# Patient Record
Sex: Female | Born: 1965 | Hispanic: Refuse to answer | Marital: Single | Smoking: Current every day smoker
Health system: Southern US, Community
[De-identification: ages and names within clinical notes are randomized; demographics above are authoritative.]

## PROBLEM LIST (undated history)

## (undated) DIAGNOSIS — Z9889 Other specified postprocedural states: Secondary | ICD-10-CM

## (undated) DIAGNOSIS — T7840XA Allergy, unspecified, initial encounter: Secondary | ICD-10-CM

## (undated) DIAGNOSIS — F32A Depression, unspecified: Secondary | ICD-10-CM

## (undated) DIAGNOSIS — T8859XA Other complications of anesthesia, initial encounter: Secondary | ICD-10-CM

## (undated) DIAGNOSIS — K219 Gastro-esophageal reflux disease without esophagitis: Secondary | ICD-10-CM

## (undated) DIAGNOSIS — R112 Nausea with vomiting, unspecified: Secondary | ICD-10-CM

## (undated) DIAGNOSIS — M722 Plantar fascial fibromatosis: Secondary | ICD-10-CM

## (undated) DIAGNOSIS — F329 Major depressive disorder, single episode, unspecified: Secondary | ICD-10-CM

## (undated) DIAGNOSIS — T4145XA Adverse effect of unspecified anesthetic, initial encounter: Secondary | ICD-10-CM

## (undated) HISTORY — PX: ENDOMETRIAL ABLATION: SHX621

## (undated) HISTORY — DX: Major depressive disorder, single episode, unspecified: F32.9

## (undated) HISTORY — PX: COLPOSCOPY: SHX161

## (undated) HISTORY — DX: Depression, unspecified: F32.A

## (undated) HISTORY — DX: Allergy, unspecified, initial encounter: T78.40XA

## (undated) HISTORY — DX: Gastro-esophageal reflux disease without esophagitis: K21.9

---

## 1988-12-08 HISTORY — PX: TUBAL LIGATION: SHX77

## 1994-06-10 HISTORY — PX: WISDOM TOOTH EXTRACTION: SHX21

## 1998-01-15 ENCOUNTER — Emergency Department (HOSPITAL_COMMUNITY): Admission: EM | Admit: 1998-01-15 | Discharge: 1998-01-15 | Payer: Self-pay | Admitting: Internal Medicine

## 1998-03-04 ENCOUNTER — Emergency Department (HOSPITAL_COMMUNITY): Admission: EM | Admit: 1998-03-04 | Discharge: 1998-03-04 | Payer: Self-pay | Admitting: Emergency Medicine

## 1999-02-04 ENCOUNTER — Encounter: Payer: Self-pay | Admitting: *Deleted

## 1999-02-04 ENCOUNTER — Emergency Department (HOSPITAL_COMMUNITY): Admission: EM | Admit: 1999-02-04 | Discharge: 1999-02-04 | Payer: Self-pay | Admitting: *Deleted

## 1999-08-04 ENCOUNTER — Encounter: Payer: Self-pay | Admitting: Emergency Medicine

## 1999-08-04 ENCOUNTER — Emergency Department (HOSPITAL_COMMUNITY): Admission: EM | Admit: 1999-08-04 | Discharge: 1999-08-04 | Payer: Self-pay | Admitting: Emergency Medicine

## 1999-11-08 ENCOUNTER — Emergency Department (HOSPITAL_COMMUNITY): Admission: EM | Admit: 1999-11-08 | Discharge: 1999-11-08 | Payer: Self-pay | Admitting: Emergency Medicine

## 1999-11-08 ENCOUNTER — Encounter: Payer: Self-pay | Admitting: Emergency Medicine

## 2000-03-17 ENCOUNTER — Emergency Department (HOSPITAL_COMMUNITY): Admission: EM | Admit: 2000-03-17 | Discharge: 2000-03-17 | Payer: Self-pay | Admitting: Emergency Medicine

## 2000-06-18 ENCOUNTER — Emergency Department (HOSPITAL_COMMUNITY): Admission: EM | Admit: 2000-06-18 | Discharge: 2000-06-18 | Payer: Self-pay | Admitting: Emergency Medicine

## 2000-06-20 ENCOUNTER — Emergency Department (HOSPITAL_COMMUNITY): Admission: EM | Admit: 2000-06-20 | Discharge: 2000-06-20 | Payer: Self-pay | Admitting: Emergency Medicine

## 2000-07-12 ENCOUNTER — Emergency Department (HOSPITAL_COMMUNITY): Admission: EM | Admit: 2000-07-12 | Discharge: 2000-07-12 | Payer: Self-pay | Admitting: Emergency Medicine

## 2000-07-17 ENCOUNTER — Emergency Department (HOSPITAL_COMMUNITY): Admission: EM | Admit: 2000-07-17 | Discharge: 2000-07-17 | Payer: Self-pay

## 2000-10-12 ENCOUNTER — Emergency Department (HOSPITAL_COMMUNITY): Admission: EM | Admit: 2000-10-12 | Discharge: 2000-10-12 | Payer: Self-pay | Admitting: Internal Medicine

## 2001-10-07 ENCOUNTER — Inpatient Hospital Stay (HOSPITAL_COMMUNITY): Admission: EM | Admit: 2001-10-07 | Discharge: 2001-10-08 | Payer: Self-pay | Admitting: Internal Medicine

## 2003-09-09 ENCOUNTER — Emergency Department (HOSPITAL_COMMUNITY): Admission: EM | Admit: 2003-09-09 | Discharge: 2003-09-09 | Payer: Self-pay | Admitting: Emergency Medicine

## 2003-09-27 ENCOUNTER — Encounter: Admission: RE | Admit: 2003-09-27 | Discharge: 2003-09-27 | Payer: Self-pay | Admitting: Family Medicine

## 2004-09-18 ENCOUNTER — Emergency Department (HOSPITAL_COMMUNITY): Admission: EM | Admit: 2004-09-18 | Discharge: 2004-09-18 | Payer: Self-pay | Admitting: Emergency Medicine

## 2005-01-17 ENCOUNTER — Emergency Department (HOSPITAL_COMMUNITY): Admission: EM | Admit: 2005-01-17 | Discharge: 2005-01-17 | Payer: Self-pay | Admitting: Emergency Medicine

## 2007-07-13 ENCOUNTER — Emergency Department (HOSPITAL_COMMUNITY): Admission: EM | Admit: 2007-07-13 | Discharge: 2007-07-13 | Payer: Self-pay | Admitting: Emergency Medicine

## 2007-07-14 ENCOUNTER — Emergency Department (HOSPITAL_COMMUNITY): Admission: EM | Admit: 2007-07-14 | Discharge: 2007-07-14 | Payer: Self-pay | Admitting: Emergency Medicine

## 2007-07-23 ENCOUNTER — Emergency Department (HOSPITAL_COMMUNITY): Admission: EM | Admit: 2007-07-23 | Discharge: 2007-07-23 | Payer: Self-pay | Admitting: Emergency Medicine

## 2010-09-28 ENCOUNTER — Emergency Department: Payer: Self-pay | Admitting: Emergency Medicine

## 2010-10-26 NOTE — Discharge Summary (Signed)
[facility]  Patient:    Lorraine Kelly, Lorraine Kelly Visit Number: [id number] MRN: [id number]          Service Type: MED Location: 3A A321 01 Attending Physician:  Teena Dunk Hor Dictated by:   Luci Bank, M.D. Admit Date:  10/07/2001 Discharge Date: 10/08/2001   CC:         Carylon Perches, M.D.   Discharge Summary  DISCHARGE DIAGNOSES: 1. Sepsis. 2. Pyelonephritis. 3. Hypotension. 4. History of bilateral tubal occlusion.  BRIEF HISTORY:  The patient is a 45 year old female who presented to the emergency room on October 07, 2001, complaining of fever and chills and pain in her left flank.  It was felt that it was pyelonephritis and in addition she was hypotensive with blood pressures in the 80s systolic.  She was admitted for IV fluids and antibiotics.  For details of admission please see dictated history and physical.  PHYSICAL EXAMINATION:  VITAL SIGNS: At the time of admission temperature 102.1.  Pulse 136. Respirations 24.  Blood pressure 138/69 on arrival to the emergency room. Subsequent blood pressures were in the 80s systolic and O2 saturation on room air was 96%.  HEENT:  Unremarkable.  NECK:  Supple.  CHEST AND LUNGS: Clear.  CARDIAC:  Rhythm was tachycardic.  ABDOMEN:  Nontender.  BACK:  CVA tenderness on the left.  EXTREMITIES:  No edema was noted.  ANCILLARY DATA:  Urine pregnancy test was negative.  White count of 7.8. Hemoglobin 12.5, hematocrit 36.8, platelets 259.  Sodium 138, potassium 3.7, chloride 110, CO2 24, glucose of 120, BUN 7, creatinine 0.8.  Calcium 8.3. Urinalysis cloudy, positive nitrate.  7-10 white cells, 3-6 RBCs.  HOSPITAL COURSE:  The patient was admitted to a medical bed.  She received aggressive IV fluid hydration with normal saline and was empirically treated with IV Levaquin.  The patient symptomatically improved and her blood pressure improved.  She was eating and drinking p.o. and ambulating about  the room without any significant difficulty and desiring discharge home. The patient will be discharged on Levaquin to a complete a total of 2 weeks of antibiotic therapy as the patient has no local physician.  Arrangements have been made for patient to follow up with Dr. Ouida Sills on Oct 16, 2001, at 4 p.m. Dictated by:   Luci Bank, M.D. Attending Physician:  Teena Dunk [facility] DD:  10/08/01 TD:  10/10/01 Job: [phone] [id number]

## 2010-10-26 NOTE — H&P (Signed)
[facility]  Patient:    Lorraine Kelly, Lorraine Kelly Visit Number: [id number] MRN: [id number]          Service Type: MED Location: 3A A321 01 Attending Physician:  Cassell Smiles. Dictated by:   Shellia Carwin, M.D. Admit Date:  10/07/2001                           History and Physical  HISTORY:  The patient is a 45 year old white female who presents to the emergency room this morning complaining of fever and chills and pain in the left side of her back.  The patient reports that she has had back pain for several days now.  She developed chills this morning.  She was nauseated but she did not vomit.  Pain is over the left lower area of her back.  She initially attributed these symptoms to current menstruation.  Patient denies any dysuria or hematuria and she denies any vaginal discharge other than her period.  She does report that she has had urinary tract infections before but no history of pyelonephritis.  PAST MEDICAL HISTORY:  None.  PAST SURGICAL HISTORY:  BTL.  CURRENT MEDICATIONS:  None.  ALLERGIES:  None.  SOCIAL HISTORY:  The patient smokes one pack tobacco per day.  She denies any alcohol use.  She works as a Child psychotherapist.  She has three children.  She is sexually active with a live-in boyfriend.  FAMILY HISTORY:  The patient is unaware of any medical problems which run in her family.  REVIEW OF SYSTEMS:  The patient denies headache, visual complaints.  No dysphagia or odynophagia.  No chest pain or shortness of breath.  Nausea as per HPI but no vomiting or diarrhea.  History of urinary tract infections as per HPI.  No leg pain or swelling and no neurologic deficits.  The patient does not get regular medical checkups or medical care.  PHYSICAL EXAMINATION:  GENERAL:  Overweight female in moderate distress.  VITAL SIGNS:  Temperature 102.1, pulse of 136, respirations 24, blood pressure of 138/69 on arrival to the emergency room.  O2  saturation on room air of 96%.  HEENT:  Pupils are equal and reactive to light.  Sclerae anicteric. Conjunctivae are not injected.  Extraocular movements appear to be intact. Oropharynx:  Mucous membranes are dry.  No oral lesions are noted.  NECK:  Supple.  CHEST:  Chest and lungs are clear.  CARDIAC:  Rhythm is tachycardic and regular.  No murmurs, rubs, or gallops are heard.  BREASTS:  Exam is deferred.  ABDOMEN:  Soft.  There is some slight tenderness to palpation over the left side but no rebound, guarding or organomegaly are noted.  BACK:  There is CVA tenderness on the left.  GU:  Exam is deferred.  EXTREMITIES:  Distal pulses are intact and calves are nontender without warmth, erythema or palpable cords noted.  NEUROLOGIC:  Grossly nonfocal.  ANCILLARY DATA:  Urine pregnancy test was negative.  White count is 7.8, hemoglobin 12.5, hematocrit of 36.8, platelets of 259,000 with 88% neutrophils.  Sodium of 138, potassium 3.7, chloride 110, CO2 of 24, glucose of 120, BUN 7, creatinine 8 and a calcium of 8.3.  Urinalysis was cloudy with positive for nitrites, though negative for leukocyte esterase, 7-10 white cells, 3-6 rbcs with a few bacteria noted.  ASSESSMENT AND PLAN:  Probable pyelonephritis.  The patient has received Rocephin in the emergency room and aggressive intravenous  fluid hydration. The patients blood pressure has been running somewhat low, with most recent blood pressures in the systolics of 80s.  It is felt that due to low blood pressure, the patient would require medical admission.  The patient will be admitted for intravenous antibiotics and intravenous fluid hydration and pain medications as needed and her clinical course will be followed.  Additionally, as the patient has no local physician, arrangements will need to be made for medical followup upon hospital discharge. Dictated by:   Shellia Carwin, M.D. Attending Physician:  Cassell Smiles DD:  10/07/01 TD:  10/07/01 Job: [id number]

## 2011-04-23 ENCOUNTER — Ambulatory Visit: Payer: Self-pay

## 2011-08-26 ENCOUNTER — Encounter (HOSPITAL_COMMUNITY): Payer: Self-pay

## 2011-08-26 ENCOUNTER — Emergency Department (HOSPITAL_COMMUNITY)
Admission: EM | Admit: 2011-08-26 | Discharge: 2011-08-26 | Disposition: A | Discharge status: Home Health | Payer: Self-pay | Attending: Emergency Medicine | Admitting: Emergency Medicine

## 2011-08-26 DIAGNOSIS — F411 Generalized anxiety disorder: Secondary | ICD-10-CM | POA: Insufficient documentation

## 2011-08-26 DIAGNOSIS — F419 Anxiety disorder, unspecified: Secondary | ICD-10-CM

## 2011-08-26 MED ORDER — ALPRAZOLAM 0.5 MG PO TABS: ORAL_TABLET | ORAL | Status: DC

## 2011-08-26 MED ORDER — ALPRAZOLAM 0.5 MG PO TABS
0.5000 mg | ORAL_TABLET | Freq: Two times a day (BID) | ORAL | Status: DC | PRN
  Administered 2011-08-26: 0.5 mg via ORAL
  Filled 2011-08-26: qty 1

## 2011-08-26 NOTE — ED Notes (Signed)
sts anxiety attacks and depression for three week.s denies si.

## 2011-08-26 NOTE — Discharge Instructions (Signed)
Anxiety and Panic Attacks Your caregiver has informed you that you are having an anxiety or panic attack. There may be many forms of this. Most of the time these attacks come suddenly and without warning. They come at any time of day, including periods of sleep, and at any time of life. They may be strong and unexplained. Although panic attacks are very scary, they are physically harmless. Sometimes the cause of your anxiety is not known. Anxiety is a protective mechanism of the body in its fight or flight mechanism. Most of these perceived danger situations are actually nonphysical situations (such as anxiety over losing a job). CAUSES  The causes of an anxiety or panic attack are many. Panic attacks may occur in otherwise healthy people given a certain set of circumstances. There may be a genetic cause for panic attacks. Some medications may also have anxiety as a side effect. SYMPTOMS  Some of the most common feelings are:  Intense terror.   Dizziness, feeling faint.   Hot and cold flashes.   Fear of going crazy.   Feelings that nothing is real.   Sweating.   Shaking.   Chest pain or a fast heartbeat (palpitations).   Smothering, choking sensations.   Feelings of impending doom and that death is near.   Tingling of extremities, this may be from over-breathing.   Altered reality (derealization).   Being detached from yourself (depersonalization).  Several symptoms can be present to make up anxiety or panic attacks. DIAGNOSIS  The evaluation by your caregiver will depend on the type of symptoms you are experiencing. The diagnosis of anxiety or panic attack is made when no physical illness can be determined to be a cause of the symptoms. TREATMENT  Treatment to prevent anxiety and panic attacks may include:  Avoidance of circumstances that cause anxiety.   Reassurance and relaxation.   Regular exercise.   Relaxation therapies, such as yoga.   Psychotherapy with a  psychiatrist or therapist.   Avoidance of caffeine, alcohol and illegal drugs.   Prescribed medication.  SEEK IMMEDIATE MEDICAL CARE IF:   You experience panic attack symptoms that are different than your usual symptoms.   You have any worsening or concerning symptoms.  Document Released: 05/27/2005 Document Revised: 05/16/2011 Document Reviewed: 09/28/2009 [facility] Patient Information 2012 [place], [place].  Use Xanax to control your anxiety.  Followup with family practice physician, for reevaluation and continued treatment.  Return for worse or uncontrolled symptoms

## 2011-08-26 NOTE — ED Provider Notes (Signed)
History     CSN: [id number]  Arrival date & time 08/26/11  1043   First MD Initiated Contact with Patient 08/26/11 1330      Chief Complaint  Patient presents with  . Anxiety    (Consider location/radiation/quality/duration/timing/severity/associated sxs/prior treatment) Patient is a 46 y.o. female presenting with anxiety.  Anxiety   patient is a 46 year old, female, with no significant past medical history, who presents to emergency department complaining of depression, and anxiety attacks.  She states that she gets them around her menstrual cycles.  She denies any systemic symptoms.  She denies suicidal or homicidal.  She has not been having any changes, or stressors, which are unusual in her family.  She denies drugs or alcohol abuse.  No past medical history on file.  No past surgical history on file.  No family history on file.  History  Substance Use Topics  . Smoking status: Current Everyday Smoker  . Smokeless tobacco: Not on file  . Alcohol Use: No    OB History    Grav Para Term Preterm Abortions TAB SAB Ect Mult Living                  Review of Systems  Psychiatric/Behavioral: Positive for sleep disturbance. Negative for suicidal ideas, hallucinations and self-injury. The patient is nervous/anxious.        Panic attacks, and depression  All other systems reviewed and are negative.    Allergies  Review of patient's allergies indicates no known allergies.  Home Medications   Current Outpatient Rx  Name Route Sig Dispense Refill  . CETIRIZINE HCL 10 MG PO TABS Oral Take 10 mg by mouth daily.    Marland Kitchen LANSOPRAZOLE 15 MG PO CPDR Oral Take 15 mg by mouth daily.      BP 133/76  Pulse 98  Temp(Src) 98.5 F (36.9 C) (Oral)  Resp 20  SpO2 97%  Physical Exam  Nursing note and vitals reviewed. Constitutional: She is oriented to person, place, and time. She appears well-developed and well-nourished.  HENT:  Head: Normocephalic and atraumatic.  Eyes:  Pupils are equal, round, and reactive to light.  Neck: Normal range of motion.  Cardiovascular: Normal rate, regular rhythm and normal heart sounds.   No murmur heard. Pulmonary/Chest: Effort normal and breath sounds normal. No respiratory distress. She has no wheezes. She has no rales.  Abdominal: Soft. She exhibits no distension and no mass. There is no tenderness. There is no rebound and no guarding.  Musculoskeletal: Normal range of motion. She exhibits no edema and no tenderness.  Neurological: She is alert and oriented to person, place, and time. No cranial nerve deficit.  Skin: Skin is warm and dry. No rash noted. No erythema.  Psychiatric: Her behavior is normal.       Flat affect    ED Course  Procedures (including critical care time)  Labs Reviewed - No data to display No results found.   1. Anxiety       MDM  Anxiety attack No suicidal or homicidal ideations.  No psychosis        Cheri Guppy, MD 08/26/11 1409

## 2011-08-26 NOTE — ED Notes (Signed)
Patient states history of depression and recently having her cycle and increased depression this month completed her cycle and still feels depressed for two weeks crying intermittent and panic attacks by shaking and increased breathing lasting for approx 5 minutes.  Patient states does not take medication for her depression or anxiety.  Denies SI or HI.

## 2012-04-20 ENCOUNTER — Emergency Department: Payer: Self-pay | Admitting: Emergency Medicine

## 2012-04-20 LAB — BASIC METABOLIC PANEL
Anion Gap: 6 — ABNORMAL LOW (ref 7–16)
BUN: 12 mg/dL (ref 7–18)
Co2: 24 mmol/L (ref 21–32)
Creatinine: 0.78 mg/dL (ref 0.60–1.30)
EGFR (African American): >60
Osmolality: 279 (ref 275–301)

## 2012-04-20 LAB — CBC
MCH: 32 pg (ref 26.0–34.0)
Platelet: 271 10*3/uL (ref 150–440)
RBC: 4.47 10*6/uL (ref 3.80–5.20)
RDW: 12.9 % (ref 11.5–14.5)

## 2012-04-20 LAB — CK TOTAL AND CKMB (NOT AT ARMC): CK, Total: 293 U/L — ABNORMAL HIGH (ref 21–215)

## 2012-04-20 LAB — TROPONIN I: Troponin-I: <0.02 ng/mL

## 2013-07-29 ENCOUNTER — Ambulatory Visit (INDEPENDENT_AMBULATORY_CARE_PROVIDER_SITE_OTHER): Payer: 59 | Admitting: Physician Assistant

## 2013-07-29 ENCOUNTER — Encounter: Payer: Self-pay | Admitting: Physician Assistant

## 2013-07-29 VITALS — BP 126/88 | HR 95 | Temp 98.0°F | Resp 16 | Ht 67.0 in | Wt 200.0 lb

## 2013-07-29 DIAGNOSIS — Z0000 Encounter for general adult medical examination without abnormal findings: Secondary | ICD-10-CM

## 2013-07-29 DIAGNOSIS — K219 Gastro-esophageal reflux disease without esophagitis: Secondary | ICD-10-CM | POA: Insufficient documentation

## 2013-07-29 DIAGNOSIS — F17200 Nicotine dependence, unspecified, uncomplicated: Secondary | ICD-10-CM | POA: Insufficient documentation

## 2013-07-29 DIAGNOSIS — M79671 Pain in right foot: Secondary | ICD-10-CM

## 2013-07-29 DIAGNOSIS — M79609 Pain in unspecified limb: Secondary | ICD-10-CM

## 2013-07-29 DIAGNOSIS — Z1231 Encounter for screening mammogram for malignant neoplasm of breast: Secondary | ICD-10-CM

## 2013-07-29 DIAGNOSIS — J302 Other seasonal allergic rhinitis: Secondary | ICD-10-CM | POA: Insufficient documentation

## 2013-07-29 DIAGNOSIS — J309 Allergic rhinitis, unspecified: Secondary | ICD-10-CM

## 2013-07-29 NOTE — Assessment & Plan Note (Signed)
Continue daily medication, Prilosec

## 2013-07-29 NOTE — Patient Instructions (Signed)
It was great to meet you today Lorraine Kelly!  Labs have been ordered for you, when you report to lab please be fasting.    I have placed referral to Dr. Charlann Boxer, sports medicine provider.    Plantar Fasciitis Plantar fasciitis is a common condition that causes foot pain. It is soreness (inflammation) of the band of tough fibrous tissue on the bottom of the foot that runs from the heel bone (calcaneus) to the ball of the foot. The cause of this soreness may be from excessive standing, poor fitting shoes, running on hard surfaces, being overweight, having an abnormal walk, or overuse (this is common in runners) of the painful foot or feet. It is also common in aerobic exercise dancers and ballet dancers. SYMPTOMS  Most people with plantar fasciitis complain of:  Severe pain in the morning on the bottom of their foot especially when taking the first steps out of bed. This pain recedes after a few minutes of walking.  Severe pain is experienced also during walking following a long period of inactivity.  Pain is worse when walking barefoot or up stairs DIAGNOSIS   Your caregiver will diagnose this condition by examining and feeling your foot.  Special tests such as X-rays of your foot, are usually not needed. PREVENTION   Consult a sports medicine professional before beginning a new exercise program.  Walking programs offer a good workout. With walking there is a lower chance of overuse injuries common to runners. There is less impact and less jarring of the joints.  Begin all new exercise programs slowly. If problems or pain develop, decrease the amount of time or distance until you are at a comfortable level.  Wear good shoes and replace them regularly.  Stretch your foot and the heel cords at the back of the ankle (Achilles tendon) both before and after exercise.  Run or exercise on even surfaces that are not hard. For example, asphalt is better than pavement.  Do not run barefoot  on hard surfaces.  If using a treadmill, vary the incline.  Do not continue to workout if you have foot or joint problems. Seek professional help if they do not improve. HOME CARE INSTRUCTIONS   Avoid activities that cause you pain until you recover.  Use ice or cold packs on the problem or painful areas after working out.  Only take over-the-counter or prescription medicines for pain, discomfort, or fever as directed by your caregiver.  Soft shoe inserts or athletic shoes with air or gel sole cushions may be helpful.  If problems continue or become more severe, consult a sports medicine caregiver or your own health care provider. Cortisone is a potent anti-inflammatory medication that may be injected into the painful area. You can discuss this treatment with your caregiver. MAKE SURE YOU:   Understand these instructions.  Will watch your condition.  Will get help right away if you are not doing well or get worse. Document Released: 02/19/2001 Document Revised: 08/19/2011 Document Reviewed: 04/20/2008 [facility] Patient Information 2014 [place], [place].    Health Maintenance, Female A healthy lifestyle and preventative care can promote health and wellness.  Maintain regular health, dental, and eye exams.  Eat a healthy diet. Foods like vegetables, fruits, whole grains, low-fat dairy products, and lean protein foods contain the nutrients you need without too many calories. Decrease your intake of foods high in solid fats, added sugars, and salt. Get information about a proper diet from your caregiver, if necessary.  Regular  physical exercise is one of the most important things you can do for your health. Most adults should get at least 150 minutes of moderate-intensity exercise (any activity that increases your heart rate and causes you to sweat) each week. In addition, most adults need muscle-strengthening exercises on 2 or more days a week.   Maintain a healthy weight. The body  mass index (BMI) is a screening tool to identify possible weight problems. It provides an estimate of body fat based on height and weight. Your caregiver can help determine your BMI, and can help you achieve or maintain a healthy weight. For adults 20 years and older:  A BMI below 18.5 is considered underweight.  A BMI of 18.5 to 24.9 is normal.  A BMI of 25 to 29.9 is considered overweight.  A BMI of 30 and above is considered obese.  Maintain normal blood lipids and cholesterol by exercising and minimizing your intake of saturated fat. Eat a balanced diet with plenty of fruits and vegetables. Blood tests for lipids and cholesterol should begin at age 51 and be repeated every 5 years. If your lipid or cholesterol levels are high, you are over 50, or you are a high risk for heart disease, you may need your cholesterol levels checked more frequently.Ongoing high lipid and cholesterol levels should be treated with medicines if diet and exercise are not effective.  If you smoke, find out from your caregiver how to quit. If you do not use tobacco, do not start.  Lung cancer screening is recommended for adults aged 34 80 years who are at high risk for developing lung cancer because of a history of smoking. Yearly low-dose computed tomography (CT) is recommended for people who have at least a 30-pack-year history of smoking and are a current smoker or have quit within the past 15 years. A pack year of smoking is smoking an average of 1 pack of cigarettes a day for 1 year (for example: 1 pack a day for 30 years or 2 packs a day for 15 years). Yearly screening should continue until the smoker has stopped smoking for at least 15 years. Yearly screening should also be stopped for people who develop a health problem that would prevent them from having lung cancer treatment.  If you are pregnant, do not drink alcohol. If you are breastfeeding, be very cautious about drinking alcohol. If you are not pregnant and  choose to drink alcohol, do not exceed 1 drink per day. One drink is considered to be 12 ounces (355 mL) of beer, 5 ounces (148 mL) of wine, or 1.5 ounces (44 mL) of liquor.  Avoid use of street drugs. Do not share needles with anyone. Ask for help if you need support or instructions about stopping the use of drugs.  High blood pressure causes heart disease and increases the risk of stroke. Blood pressure should be checked at least every 1 to 2 years. Ongoing high blood pressure should be treated with medicines, if weight loss and exercise are not effective.  If you are 68 to 46 years old, ask your caregiver if you should take aspirin to prevent strokes.  Diabetes screening involves taking a blood sample to check your fasting blood sugar level. This should be done once every 3 years, after age 4, if you are within normal weight and without risk factors for diabetes. Testing should be considered at a younger age or be carried out more frequently if you are overweight and have at least  1 risk factor for diabetes.  Breast cancer screening is essential preventative care for women. You should practice "breast self-awareness." This means understanding the normal appearance and feel of your breasts and may include breast self-examination. Any changes detected, no matter how small, should be reported to a caregiver. Women in their 57s and 30s should have a clinical breast exam (CBE) by a caregiver as part of a regular health exam every 1 to 3 years. After age 10, women should have a CBE every year. Starting at age 66, women should consider having a mammogram (breast X-ray) every year. Women who have a family history of breast cancer should talk to their caregiver about genetic screening. Women at a high risk of breast cancer should talk to their caregiver about having an MRI and a mammogram every year.  Breast cancer gene (BRCA)-related cancer risk assessment is recommended for women who have family members  with BRCA-related cancers. BRCA-related cancers include breast, ovarian, tubal, and peritoneal cancers. Having family members with these cancers may be associated with an increased risk for harmful changes (mutations) in the breast cancer genes BRCA1 and BRCA2. Results of the assessment will determine the need for genetic counseling and BRCA1 and BRCA2 testing.  The Pap test is a screening test for cervical cancer. Women should have a Pap test starting at age 43. Between ages 54 and 59, Pap tests should be repeated every 2 years. Beginning at age 11, you should have a Pap test every 3 years as long as the past 3 Pap tests have been normal. If you had a hysterectomy for a problem that was not cancer or a condition that could lead to cancer, then you no longer need Pap tests. If you are between ages 13 and 32, and you have had normal Pap tests going back 10 years, you no longer need Pap tests. If you have had past treatment for cervical cancer or a condition that could lead to cancer, you need Pap tests and screening for cancer for at least 20 years after your treatment. If Pap tests have been discontinued, risk factors (such as a new sexual partner) need to be reassessed to determine if screening should be resumed. Some women have medical problems that increase the chance of getting cervical cancer. In these cases, your caregiver may recommend more frequent screening and Pap tests.  The human papillomavirus (HPV) test is an additional test that may be used for cervical cancer screening. The HPV test looks for the virus that can cause the cell changes on the cervix. The cells collected during the Pap test can be tested for HPV. The HPV test could be used to screen women aged 15 years and older, and should be used in women of any age who have unclear Pap test results. After the age of 35, women should have HPV testing at the same frequency as a Pap test.  Colorectal cancer can be detected and often prevented.  Most routine colorectal cancer screening begins at the age of 75 and continues through age 57. However, your caregiver may recommend screening at an earlier age if you have risk factors for colon cancer. On a yearly basis, your caregiver may provide home test kits to check for hidden blood in the stool. Use of a small camera at the end of a tube, to directly examine the colon (sigmoidoscopy or colonoscopy), can detect the earliest forms of colorectal cancer. Talk to your caregiver about this at age 56, when routine screening begins.  Direct examination of the colon should be repeated every 5 to 10 years through age 29, unless early forms of pre-cancerous polyps or small growths are found.  Hepatitis C blood testing is recommended for all people born from [birth year] through 1963 and any individual with known risks for hepatitis C.  Practice safe sex. Use condoms and avoid high-risk sexual practices to reduce the spread of sexually transmitted infections (STIs). Sexually active women aged 53 and younger should be checked for Chlamydia, which is a common sexually transmitted infection. Older women with new or multiple partners should also be tested for Chlamydia. Testing for other STIs is recommended if you are sexually active and at increased risk.  Osteoporosis is a disease in which the bones lose minerals and strength with aging. This can result in serious bone fractures. The risk of osteoporosis can be identified using a bone density scan. Women ages 34 and over and women at risk for fractures or osteoporosis should discuss screening with their caregivers. Ask your caregiver whether you should be taking a calcium supplement or vitamin D to reduce the rate of osteoporosis.  Menopause can be associated with physical symptoms and risks. Hormone replacement therapy is available to decrease symptoms and risks. You should talk to your caregiver about whether hormone replacement therapy is right for you.  Use  sunscreen. Apply sunscreen liberally and repeatedly throughout the day. You should seek shade when your shadow is shorter than you. Protect yourself by wearing long sleeves, pants, a wide-brimmed hat, and sunglasses year round, whenever you are outdoors.  Notify your caregiver of new moles or changes in moles, especially if there is a change in shape or color. Also notify your caregiver if a mole is larger than the size of a pencil eraser.  Stay current with your immunizations. Document Released: 12/10/2010 Document Revised: 09/21/2012 Document Reviewed: 12/10/2010 [facility] Patient Information 2014 [place].

## 2013-07-29 NOTE — Progress Notes (Signed)
Patient ID: Lorraine Kelly is a 48 y.o. female DOB: [phone] MRN: [id number]     HPI:  Patient is a 48 year old female who presents to the office to establish care. patient reports history of GERD well controlled with Prilosec, seasonal allergies well controlled with Zyrtec, history of depression years ago, resolved now. Has not seen PCP in years, stating has not had insurance until recently. Reports recent history of right foot pain. Pain is constant, increases when bearing weight or when pointing toes. Has used shoe inserts and ankle support with minimal relief of pain. Denies injury or trauma to the foot or ankle. Denies chest pain/palpitations, SOB, cough, N/V/F/C, change in bowel/bladder habits, change in vision or visual disturbance. Patient is a smoker with a planned quit date of 08/02/2013.   Influenza: declined Tetanus:2006 PAP: 07/2010 LMP: 07/12/13  ROS: As stated in HPI. All other systems negative  Past Medical History  Diagnosis Date  . Depression   . GERD (gastroesophageal reflux disease)   . Allergy    History reviewed. No pertinent family history. History   Social History  . Marital Status: Unknown    Spouse Name: N/A    Number of Children: N/A  . Years of Education: N/A   Social History Main Topics  . Smoking status: Current Every Day Smoker  . Smokeless tobacco: None  . Alcohol Use: No  . Drug Use: No  . Sexual Activity: None   Other Topics Concern  . None   Social History Narrative  . None   Past Surgical History  Procedure Laterality Date  . Tubal ligation  12/1988   Current Outpatient Prescriptions on File Prior to Visit  Medication Sig Dispense Refill  . cetirizine (ZYRTEC) 10 MG tablet Take 10 mg by mouth daily.       No current facility-administered medications on file prior to visit.   No Known Allergies  PE: CONSTITUTIONAL: Well developed, well nourished, pleasant, appears stated age, in NAD HEENT: normocephalic, atraumatic, bilateral  ext/int canals normal. Bilateral TM's without injections, bulging, erythema. Nose normal, uvula midline, oropharynx clear and moist. EYES: PERRLA, bilateral EOM and conjunctiva normal NECK: FROM, supple, without thyromegaly or mass,  CARDIO: RRR, normal S1 and S2, distal pulses intact. PULM/CHEST CTA bilateral, no wheezes, rales or rhonchi. Non tender. ABD: appearance normal, soft, nontender. Normal bowel sounds x 4 quadrants, nonpalpable spleen, liver, kidney. GU: deferred to GYN MUSC: FROM U/LE bilateral, thoracic and lumbar spine with FROM. right foot mildly tender to palpation of plantar surface from ball of foot to heel. No erythema, ecchymosis or edema noted.  LYMPH: no cervical, supraclavicular adenopathy NEURO: alert and oriented x 3, no cranial nerve deficit, motor strength and coordination NL. DTR's intact. Negative romberg. Gait normal. SKIN: warm, dry, no rash or lesions noted. PSYCH: Mood and affect normal, speech normal.   ASSESSMENT and PLAN   CPX/v70.0 - Patient has been counseled on age-appropriate routine health concerns for screening and prevention. These are reviewed and up-to-date. Immunizations are up-to-date or declined. Labs ordered and will be reviewed.  GERD: Continue daily medication, Prilosec  Seasonal allergies Continue with Zyrtec as needed  Smoker: Discussed quitting plan, quit date scheduled for 08/02/13  Foot pain: Consistent with plantar fasciitis.  Counseled patient to use Ibuprofen 600 mg tid for one week and to rest foot. Discussed exercises to help strengthen fascia. Referral to Dr. Tamala Julian, sports medicine.  Work note to limit time standing on foot to no more than four  hours.

## 2013-07-29 NOTE — Assessment & Plan Note (Signed)
Continue with Zyrtec as needed

## 2013-07-29 NOTE — Assessment & Plan Note (Signed)
Currently smoking 10 cigarettes a day, slowly weaning self off. Discussed quitting plan, quit date scheduled for 08/02/13

## 2013-08-04 ENCOUNTER — Encounter: Payer: Self-pay | Admitting: Family Medicine

## 2013-08-04 ENCOUNTER — Other Ambulatory Visit (INDEPENDENT_AMBULATORY_CARE_PROVIDER_SITE_OTHER): Payer: 59

## 2013-08-04 ENCOUNTER — Ambulatory Visit (INDEPENDENT_AMBULATORY_CARE_PROVIDER_SITE_OTHER): Payer: 59 | Admitting: Family Medicine

## 2013-08-04 VITALS — BP 130/74 | HR 95 | Temp 98.4°F | Resp 16 | Wt 204.1 lb

## 2013-08-04 DIAGNOSIS — M79609 Pain in unspecified limb: Secondary | ICD-10-CM

## 2013-08-04 DIAGNOSIS — M722 Plantar fascial fibromatosis: Secondary | ICD-10-CM | POA: Insufficient documentation

## 2013-08-04 DIAGNOSIS — M79671 Pain in right foot: Secondary | ICD-10-CM

## 2013-08-04 MED ORDER — MELOXICAM 15 MG PO TABS: 15.0000 mg | ORAL_TABLET | Freq: Every day | ORAL | Status: DC

## 2013-08-04 NOTE — Progress Notes (Signed)
Pre visit review using our clinic review tool, if applicable. No additional management support is needed unless otherwise documented below in the visit note. 

## 2013-08-04 NOTE — Progress Notes (Signed)
  [facility] [address] [place], [place] [address] Phone: [phone] Subjective:    I'm seeing this patient by the request  of:  Stacy Gardner, PA-C   CC: right foot pain  EGB:[id number] Lorraine Kelly is a 48 y.o. female coming in with complaint of right foot pain. Patient states she has had this pain for multiple months. Patient does not remember any true injury. Patient states that she has pain that seems to be constant and seems to be worse with weightbearing 1.8 her toes down. Patient has tried different things such as over-the-counter inserts in her shoes as well as ankle support without any significant relief. Patient denies any numbness or tingling but states that the pain can wake her up at night. Patient the severity of 8/10.     Past medical history, social, surgical and family history all reviewed in electronic medical record.   Review of Systems: No headache, visual changes, nausea, vomiting, diarrhea, constipation, dizziness, abdominal pain, skin rash, fevers, chills, night sweats, weight loss, swollen lymph nodes, body aches, joint swelling, muscle aches, chest pain, shortness of breath, mood changes.   Objective Blood pressure 130/74, pulse 95, temperature 98.4 F (36.9 C), temperature source Oral, resp. rate 16, weight 204 lb 1.3 oz (92.57 kg), last menstrual period 07/12/2013, SpO2 98.00%.  General: No apparent distress alert and oriented x3 mood and affect normal, dressed appropriately.  HEENT: Pupils equal, extraocular movements intact  Respiratory: Patient's speak in full sentences and does not appear short of breath  Cardiovascular: No lower extremity edema, non tender, no erythema  Skin: Warm dry intact with no signs of infection or rash on extremities or on axial skeleton.  Abdomen: Soft nontender  Neuro: Cranial nerves II through XII are intact, neurovascularly intact in all extremities with 2+ DTRs and 2+ pulses.  Lymph: No  lymphadenopathy of posterior or anterior cervical chain or axillae bilaterally.  Gait antalgic gait  MSK:  Non tender with full range of motion and good stability and symmetric strength and tone of shoulders, elbows, wrist, hip, knee and ankles bilaterally.  Right foot exam.  Normal inspection with no visable or palpable fat pad atrophy and no visible swelling/erythema. Patient is tender at medial insertion of plantar fascia into calcaneus. Great toe motion: mild limius Arch shape: normal Other foot breakdown: Mild breakdown of the transverse arch with splaying between the first and second toes the Contralateral side unremarkable  MSK US performed of: Right This study was ordered, performed, and interpreted by Charlann Boxer D.O.  Foot/Ankle:   All structures visualized.   Talar dome unremarkable  Ankle mortise without effusion. Peroneus longus and brevis tendons unremarkable on long and transverse views without sheath effusions. Posterior tibialis, flexor hallucis longus, and flexor digitorum longus tendons unremarkable on long and transverse views without sheath effusions. Achilles tendon visualized along length of tendon and unremarkable on long and transverse views without sheath effusion. Anterior Talofibular Ligament and Calcaneofibular Ligaments unremarkable and intact. Deltoid Ligament unremarkable and intact. Plantar fascia intact and but with significant hypoechoic changes measuring 1.23 cm in diameter. In addition this patient has fibromas numbering 45 in total. One right at the insertion is likely causing significant pain .  IMPRESSION:  Plantar fasciitis with multiple fibromas.     Impression and Recommendations:     This case required medical decision making of moderate complexity.

## 2013-08-04 NOTE — Assessment & Plan Note (Signed)
Patient does have a tear in the plantar fascia but also has significant multiple fibromas that I think are likely causing patient's pain. Patient is having such severe pain and does have problems with ambulation that I do think the Cam Walker could be beneficial. Discussed icing protocol as well as anti-inflammatories. Patient also will try home exercise program. Patient will come back in 2-3 weeks. At that time if her continuing to have some improvement we'll continue with conservative therapy if not we need to consider nitroglycerin versus injection. Patient may need custom orthotics in the future as well. We will evaluate further at followup.

## 2013-08-04 NOTE — Patient Instructions (Addendum)
Very nice to meet you Please read handouts on Plantar Fascitis.  STRETCHING and Strengthening program critically important.  Strengthening on foot and calf muscles as seen in handout. Calf raises, 2 legged, on step go up on toes, come down slow for count of 4, repeat 30 reps 1 set daily for 1st week, 2 sets daily 2nd week and 3 sets daily thereafter.  Walk on toes across room, 10 times, walk backward on toes across room 10 times. Also walk on heels forward across room 10 x.  DO NOT WALK BARE FOOT FOR NOW. Foot massage with tennis ball. Ice bath 20 minutes at least once daily. . Meloxicam daily for next 10 days then as needed.  Towel Scrunches: get a towel or hand towel, use toes to pick up and scrunch up the towel.  Marble pick-ups, practice picking up marbles with toes and placing into a cup  NEEDS TO BE DONE EVERY DAY  Recommended over the counter insoles. ([employer] orthotics at [employer])  A rigid shoe with good arch support helps: Dansko (great), Bronwen Betters No easily bendable shoes.   Tuli's heel cups  Come back in 3-4 weeks. If still in pain may consider injections, nitro or orthotics.

## 2013-08-25 ENCOUNTER — Encounter: Payer: Self-pay | Admitting: Family Medicine

## 2013-08-25 ENCOUNTER — Ambulatory Visit (INDEPENDENT_AMBULATORY_CARE_PROVIDER_SITE_OTHER)
Admission: RE | Admit: 2013-08-25 | Discharge: 2013-08-25 | Disposition: A | Discharge status: Home / Self Care | Payer: 59 | Source: Ambulatory Visit | Attending: Family Medicine | Admitting: Family Medicine

## 2013-08-25 ENCOUNTER — Other Ambulatory Visit (INDEPENDENT_AMBULATORY_CARE_PROVIDER_SITE_OTHER): Payer: 59

## 2013-08-25 ENCOUNTER — Ambulatory Visit (INDEPENDENT_AMBULATORY_CARE_PROVIDER_SITE_OTHER): Payer: 59 | Admitting: Family Medicine

## 2013-08-25 ENCOUNTER — Encounter: Payer: Self-pay | Admitting: *Deleted

## 2013-08-25 VITALS — BP 132/80 | HR 97

## 2013-08-25 DIAGNOSIS — M79609 Pain in unspecified limb: Secondary | ICD-10-CM

## 2013-08-25 DIAGNOSIS — M79671 Pain in right foot: Secondary | ICD-10-CM

## 2013-08-25 DIAGNOSIS — M722 Plantar fascial fibromatosis: Secondary | ICD-10-CM

## 2013-08-25 NOTE — Patient Instructions (Signed)
Good to see you Continue everything we are doing Try the topical cream 2 times a day.  We will get xray today.  We will order an MRI. Make an appointment 1-2 days after MRI and we will go over the results.

## 2013-08-25 NOTE — Assessment & Plan Note (Addendum)
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 She is not responding very well to conservative therapy at this time. In addition this patient does have a soft tissue mass it seems to be a new finding from previous exam. I would like to get an MRI of the foot to further evaluate this mass. Patient will come back within 2 days after the MRI and we will discuss further. In the interim patient will continue with the current therapy that is made about 40% improvement. Depending on findings this will change the evaluation and treatment options.  Spent greater than 25 minutes with patient face-to-face and had greater than 50% of counseling including as described above in assessment and plan.

## 2013-08-25 NOTE — Progress Notes (Signed)
[facility] [address] [place], [place] [address] Phone: [phone] Subjective:     CC: right foot pain follow up  ZWC:[id number] Lorraine Kelly is a 48 y.o. female coming in with complaint of right foot pain. 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 Patient is been wearing the Cam Walker and taken of meloxicam with about 40% improvement. Patient is having difficulty walking and work on a regular basis on hard concrete floors. Patient states that the pain seems to have migrated somewhat to the arch of the foot. Patient denies any numbness or tingling.    Past medical history, social, surgical and family history all reviewed in electronic medical record.   Review of Systems: No headache, visual changes, nausea, vomiting, diarrhea, constipation, dizziness, abdominal pain, skin rash, fevers, chills, night sweats, weight loss, swollen lymph nodes, body aches, joint swelling, muscle aches, chest pain, shortness of breath, mood changes.   Objective Blood pressure 132/80, pulse 97, last menstrual period 08/11/2013, SpO2 94.00%.  General: No apparent distress alert and oriented x3 mood and affect normal, dressed appropriately.  HEENT: Pupils equal, extraocular movements intact  Respiratory: Patient's speak in full sentences and does not appear short of breath  Cardiovascular: No lower extremity edema, non tender, no erythema  Skin: Warm dry intact with no signs of infection or rash on extremities or on axial skeleton.  Abdomen: Soft nontender  Neuro: Cranial nerves II through XII are intact, neurovascularly intact in all extremities with 2+ DTRs and 2+ pulses.  Lymph: No lymphadenopathy of posterior or anterior cervical chain or axillae bilaterally.  Gait antalgic gait  MSK:  Non tender with full range of motion and good stability and symmetric strength and tone of shoulders, elbows,  wrist, hip, knee and ankles bilaterally.  Right foot exam.  Normal inspection with no visable or palpable fat pad atrophy and no visible swelling/erythema. Patient is tender at medial insertion of plantar fascia into calcaneus. Patient also has a new mass from previous exam that is severely tender to palpation. Feels like soft tissue. Noncompressible. Great toe motion: mild limius Arch shape: normal Other foot breakdown: Mild breakdown of the transverse arch with splaying between the first and second toes the Contralateral side unremarkable  MSK US performed of: Right This study was ordered, performed, and interpreted by Charlann Boxer D.O.  Foot/Ankle:   All structures visualized.   Talar dome unremarkable  Ankle mortise without effusion. Peroneus longus and brevis tendons unremarkable on long and transverse views without sheath effusions. Posterior tibialis, flexor hallucis longus, and flexor digitorum longus tendons unremarkable on long and transverse views without sheath effusions. Achilles tendon visualized along length of tendon and unremarkable on long and transverse views without sheath effusion. Anterior Talofibular Ligament and Calcaneofibular Ligaments unremarkable and intact. Deltoid Ligament unremarkable and intact. Plantar fascia intact and but with significant hypoechoic changes measuring 1.03 cm in diameter this is down from 1.23 cm previously. 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 She has a very large one no or a soft tissue mass on the medial aspect of the foot on the plantars surface. Measures approximately 3 cm in diameter IMPRESSION:  Plantar fasciitis with multiple fibromas patient has a very large soft tissue mass on the medial aspect of the arch. Seems to have good capsular formation. Does appear to be soft tissue.     Impression and Recommendations:  This case required medical decision making of moderate complexity.

## 2013-08-26 ENCOUNTER — Other Ambulatory Visit: Payer: Self-pay | Admitting: *Deleted

## 2013-08-26 DIAGNOSIS — M79671 Pain in right foot: Secondary | ICD-10-CM

## 2013-08-27 ENCOUNTER — Ambulatory Visit
Admission: RE | Admit: 2013-08-27 | Discharge: 2013-08-27 | Disposition: A | Discharge status: Home / Self Care | Payer: 59 | Source: Ambulatory Visit | Attending: Family Medicine | Admitting: Family Medicine

## 2013-08-27 DIAGNOSIS — M79671 Pain in right foot: Secondary | ICD-10-CM

## 2013-08-27 MED ORDER — GADOBENATE DIMEGLUMINE 529 MG/ML IV SOLN
19.0000 mL | Freq: Once | INTRAVENOUS | Status: AC | PRN
  Administered 2013-08-27: 19 mL via INTRAVENOUS

## 2013-08-31 ENCOUNTER — Ambulatory Visit (INDEPENDENT_AMBULATORY_CARE_PROVIDER_SITE_OTHER): Payer: 59 | Admitting: Family Medicine

## 2013-08-31 ENCOUNTER — Encounter: Payer: Self-pay | Admitting: Family Medicine

## 2013-08-31 ENCOUNTER — Other Ambulatory Visit (INDEPENDENT_AMBULATORY_CARE_PROVIDER_SITE_OTHER): Payer: 59

## 2013-08-31 VITALS — BP 130/84 | HR 98

## 2013-08-31 DIAGNOSIS — M722 Plantar fascial fibromatosis: Secondary | ICD-10-CM

## 2013-08-31 MED ORDER — HYDROCODONE-ACETAMINOPHEN 10-325 MG PO TABS: 1.0000 | ORAL_TABLET | Freq: Three times a day (TID) | ORAL | Status: DC | PRN

## 2013-08-31 MED ORDER — NITROGLYCERIN 0.2 MG/HR TD PT24: MEDICATED_PATCH | TRANSDERMAL | Status: DC

## 2013-08-31 NOTE — Assessment & Plan Note (Signed)
Patient had an ultrasound-guided injection today with complete resolution of pain. Patient knows that this will be temporary. We'll also start formal physical therapy. In addition this will start the nitroglycerin patches. Patient was warned the potential side effects. Patient will continue the other modalities. Patient will followup in 3 weeks. At that time we'll get her out of the boot and start her on a more progressive activity.

## 2013-08-31 NOTE — Patient Instructions (Signed)
Good to see you Ice in about 4 hours and continue 1-2 times a day Physical therapy will be calling you Nitroglycerin Protocol   Apply 1/4 nitroglycerin patch to affected area daily.  Change position of patch within the affected area every 24 hours.  You may experience a headache during the first 1-2 weeks of using the patch, these should subside.  If you experience headaches after beginning nitroglycerin patch treatment, you may take your preferred over the counter pain reliever.  Another side effect of the nitroglycerin patch is skin irritation or rash related to patch adhesive.  Please notify our office if you develop more severe headaches or rash, and stop the patch.  Tendon healing with nitroglycerin patch may require 12 to 24 weeks depending on the extent of injury.  Men should not use if taking Viagra, Cialis, or Levitra.   Do not use if you have migraines or rosacea.  Norco only for sleep.  Come back in 3 weeks so we can get you in shoes.

## 2013-08-31 NOTE — Progress Notes (Signed)
  [facility] [address] [place], [place] [address] Phone: [phone] Subjective:     CC: right foot pain follow up  [id number] Lorraine Kelly is a 48 y.o. female coming in with complaint of right foot pain. Patient has been diagnosed plantar fasciitis. Patient was getting better and was concern for the more of a fibromatosis as well as her medial aspect of her foot having significant muscle spasm first Mass. MRI was ordered. Patient is here for the MRI results. MRI was reviewed by me today. Patient's MRI shows the patient does have plantar fasciitis but otherwise fairly unremarkable with no fibromatosis noted. Patient states she continues to have pain and continues to wear the Cam Walker. Patient denies any new symptoms.    Past medical history, social, surgical and family history all reviewed in electronic medical record.   Review of Systems: No headache, visual changes, nausea, vomiting, diarrhea, constipation, dizziness, abdominal pain, skin rash, fevers, chills, night sweats, weight loss, swollen lymph nodes, body aches, joint swelling, muscle aches, chest pain, shortness of breath, mood changes.   Objective Last menstrual period 08/11/2013.  General: No apparent distress alert and oriented x3 mood and affect normal, dressed appropriately.  HEENT: Pupils equal, extraocular movements intact  Respiratory: Patient's speak in full sentences and does not appear short of breath  Cardiovascular: No lower extremity edema, non tender, no erythema  Skin: Warm dry intact with no signs of infection or rash on extremities or on axial skeleton.  Abdomen: Soft nontender  Neuro: Cranial nerves II through XII are intact, neurovascularly intact in all extremities with 2+ DTRs and 2+ pulses.  Lymph: No lymphadenopathy of posterior or anterior cervical chain or axillae bilaterally.  Gait antalgic gait  MSK:  Non tender with full range of motion and good stability and  symmetric strength and tone of shoulders, elbows, wrist, hip, knee and ankles bilaterally.  Right foot exam.  Normal inspection with no visable or palpable fat pad atrophy and no visible swelling/erythema. Patient is tender at medial insertion of plantar fascia into calcaneus. Patient also has a new mass from previous exam that is severely tender to palpation. Feels like soft tissue. Noncompressible. Great toe motion: mild limius Arch shape: normal Other foot breakdown: Mild breakdown of the transverse arch with splaying between the first and second toes the Contralateral side unremarkable  Procedure: Real-time Ultrasound Guided Injection of right plantar fasciitis Device: GE Logiq E  Ultrasound guided injection is preferred based studies that show increased duration, increased effect, greater accuracy, decreased procedural pain, increased response rate, and decreased cost with ultrasound guided versus blind injection.  Verbal informed consent obtained.  Time-out conducted.  Noted no overlying erythema, induration, or other signs of local infection.  Skin prepped in a sterile fashion.  Local anesthesia: Topical Ethyl chloride.  With sterile technique and under real time ultrasound guidance: From the medial direction patient was injected with a 25-gauge 1 inch needle with 1 cc of 0.5% Marcaine and 1 cc of Kenalog 40 mg/dL.   Completed without difficulty  Pain immediately resolved suggesting accurate placement of the medication.  Advised to call if fevers/chills, erythema, induration, drainage, or persistent bleeding.  Images permanently stored and available for review in the ultrasound unit.  Impression: Technically successful ultrasound guided injection.     Impression and Recommendations:     This case required medical decision making of moderate complexity.

## 2013-09-09 ENCOUNTER — Ambulatory Visit: Payer: 59 | Attending: Family Medicine | Admitting: Physical Therapy

## 2013-09-09 DIAGNOSIS — M25673 Stiffness of unspecified ankle, not elsewhere classified: Secondary | ICD-10-CM | POA: Insufficient documentation

## 2013-09-09 DIAGNOSIS — IMO0001 Reserved for inherently not codable concepts without codable children: Secondary | ICD-10-CM | POA: Insufficient documentation

## 2013-09-09 DIAGNOSIS — M25579 Pain in unspecified ankle and joints of unspecified foot: Secondary | ICD-10-CM | POA: Insufficient documentation

## 2013-09-09 DIAGNOSIS — R5381 Other malaise: Secondary | ICD-10-CM | POA: Insufficient documentation

## 2013-09-09 DIAGNOSIS — M25676 Stiffness of unspecified foot, not elsewhere classified: Secondary | ICD-10-CM | POA: Insufficient documentation

## 2013-09-14 ENCOUNTER — Encounter: Payer: 59 | Admitting: Rehabilitation

## 2013-09-16 ENCOUNTER — Ambulatory Visit: Payer: 59 | Admitting: Physical Therapy

## 2013-09-20 ENCOUNTER — Ambulatory Visit: Payer: 59 | Admitting: Physical Therapy

## 2013-09-21 ENCOUNTER — Encounter: Payer: Self-pay | Admitting: Family Medicine

## 2013-09-21 ENCOUNTER — Ambulatory Visit (INDEPENDENT_AMBULATORY_CARE_PROVIDER_SITE_OTHER): Payer: 59 | Admitting: Family Medicine

## 2013-09-21 VITALS — BP 140/88 | HR 101

## 2013-09-21 DIAGNOSIS — M722 Plantar fascial fibromatosis: Secondary | ICD-10-CM

## 2013-09-21 NOTE — Progress Notes (Signed)
  [facility] [address] [place], [place] [address] Phone: [phone] Subjective:     CC: right foot pain follow up  [id number] Lorraine Kelly is a 48 y.o. female coming in with complaint of right foot pain. Patient has had plantar fasciitis for quite some time. Patient did have an injection, start a nitroglycerin patch, and formal physical therapy. Patient states all of these have been helping and she's 85% better. Patient states only the arch of her foot seems to be painful and strapping of physical therapy as well as dry needling has been helpful. Patient does not have any side effects to the nitroglycerin. Patient is happy with her results at this time.    Past medical history, social, surgical and family history all reviewed in electronic medical record.   Review of Systems: No headache, visual changes, nausea, vomiting, diarrhea, constipation, dizziness, abdominal pain, skin rash, fevers, chills, night sweats, weight loss, swollen lymph nodes, body aches, joint swelling, muscle aches, chest pain, shortness of breath, mood changes.   Objective Blood pressure 140/88, pulse 101, last menstrual period 08/11/2013, SpO2 99.00%.  General: No apparent distress alert and oriented x3 mood and affect normal, dressed appropriately.  HEENT: Pupils equal, extraocular movements intact  Respiratory: Patient's speak in full sentences and does not appear short of breath  Cardiovascular: No lower extremity edema, non tender, no erythema  Skin: Warm dry intact with no signs of infection or rash on extremities or on axial skeleton.  Abdomen: Soft nontender  Neuro: Cranial nerves II through XII are intact, neurovascularly intact in all extremities with 2+ DTRs and 2+ pulses.  Lymph: No lymphadenopathy of posterior or anterior cervical chain or axillae bilaterally.  Gait antalgic gait  MSK:  Non tender with full range of motion and good stability and symmetric strength  and tone of shoulders, elbows, wrist, hip, knee and ankles bilaterally.  Right foot exam.  Normal inspection with no visable or palpable fat pad atrophy and no visible swelling/erythema. The patient is only minimal tender to palpation over the medial calcaneus . Still tender over the flexor pollicis longus muscle Great toe motion: mild limius Arch shape: normal Other foot breakdown: Mild breakdown of the transverse arch with splaying between the first and second toes the Contralateral side unremarkable      Impression and Recommendations:     This case required medical decision making of moderate complexity.

## 2013-09-21 NOTE — Patient Instructions (Signed)
Good to see you Finish up physical therapy Continue the patch as well.  Still ice at the end of long days See you in may and we will make orthotics.

## 2013-09-21 NOTE — Assessment & Plan Note (Signed)
Patient is making some improvement which is wonderful. We discussed continuing icing as well as home exercises and finishing physical therapy over the course of the next 3 weeks. At that point I would like her to come back and we'll consider doing orthotics. This would help with patient's arch pain and hopefully cause the spasm that we are seeing in the flexor hallux longus muscle to dissipate which would help her pain tremendously. We will discuss further at followup in 3 weeks.  Spent greater than 25 minutes with patient face-to-face and had greater than 50% of counseling including as described above in assessment and plan.

## 2013-09-22 ENCOUNTER — Ambulatory Visit: Payer: 59 | Admitting: Physical Therapy

## 2013-09-30 ENCOUNTER — Ambulatory Visit: Payer: 59 | Admitting: Physical Therapy

## 2013-10-05 ENCOUNTER — Ambulatory Visit: Payer: 59 | Admitting: Physical Therapy

## 2013-10-11 ENCOUNTER — Ambulatory Visit: Payer: 59 | Attending: Physician Assistant | Admitting: Physical Therapy

## 2013-10-11 DIAGNOSIS — R5381 Other malaise: Secondary | ICD-10-CM | POA: Insufficient documentation

## 2013-10-11 DIAGNOSIS — M25673 Stiffness of unspecified ankle, not elsewhere classified: Secondary | ICD-10-CM | POA: Insufficient documentation

## 2013-10-11 DIAGNOSIS — M25669 Stiffness of unspecified knee, not elsewhere classified: Secondary | ICD-10-CM | POA: Insufficient documentation

## 2013-10-11 DIAGNOSIS — M25579 Pain in unspecified ankle and joints of unspecified foot: Secondary | ICD-10-CM | POA: Insufficient documentation

## 2013-10-11 DIAGNOSIS — Z5189 Encounter for other specified aftercare: Secondary | ICD-10-CM | POA: Insufficient documentation

## 2013-10-11 DIAGNOSIS — M25676 Stiffness of unspecified foot, not elsewhere classified: Secondary | ICD-10-CM | POA: Insufficient documentation

## 2013-10-12 ENCOUNTER — Encounter: Payer: Self-pay | Admitting: Family Medicine

## 2013-10-12 ENCOUNTER — Ambulatory Visit (INDEPENDENT_AMBULATORY_CARE_PROVIDER_SITE_OTHER): Payer: 59 | Admitting: Family Medicine

## 2013-10-12 VITALS — BP 136/82 | HR 109

## 2013-10-12 DIAGNOSIS — M722 Plantar fascial fibromatosis: Secondary | ICD-10-CM

## 2013-10-12 NOTE — Assessment & Plan Note (Signed)
Patient was doing extremely well but has had some mild tenderness to. Because the patient's workup and she is at an increased risk of having a re\re exacerbation. I do feel that orthotics after all conservative measures have been tried was the next best appropriate step. Patient had these me today and will increase her wear slowly over the course of time. We discussed continuing the icing as well as the physical therapy. Patient was then come back again in 4 weeks for further evaluation.

## 2013-10-12 NOTE — Patient Instructions (Signed)
You are doing great Continue exercises and PT Ice bath at night With orthotics walk today then tomorrow 4 hours max and increase 1 hour a day for until full day.  See you in 4 weeks

## 2013-10-12 NOTE — Progress Notes (Signed)
  [facility] [address] [place], [place] [address] Phone: [phone] Subjective:     CC: right foot pain follow up  [id number] Lorraine Kelly is a 48 y.o. female coming in with complaint of right foot pain. Patient has had plantar fasciitis for quite some time. Patient did have an injection, start a nitroglycerin patch, and formal physical therapy. Patient states all of these have been helping and she's 95% better. Patient is actually actively looking for another job because she thinks her job is contributing to the pain. Patient denies any new symptoms.    Past medical history, social, surgical and family history all reviewed in electronic medical record.   Review of Systems: No headache, visual changes, nausea, vomiting, diarrhea, constipation, dizziness, abdominal pain, skin rash, fevers, chills, night sweats, weight loss, swollen lymph nodes, body aches, joint swelling, muscle aches, chest pain, shortness of breath, mood changes.   Objective Blood pressure 136/82, pulse 109, SpO2 98.00%.  General: No apparent distress alert and oriented x3 mood and affect normal, dressed appropriately.  HEENT: Pupils equal, extraocular movements intact  Respiratory: Patient's speak in full sentences and does not appear short of breath  Cardiovascular: No lower extremity edema, non tender, no erythema  Skin: Warm dry intact with no signs of infection or rash on extremities or on axial skeleton.  Abdomen: Soft nontender  Neuro: Cranial nerves II through XII are intact, neurovascularly intact in all extremities with 2+ DTRs and 2+ pulses.  Lymph: No lymphadenopathy of posterior or anterior cervical chain or axillae bilaterally.  Gait antalgic gait  MSK:  Non tender with full range of motion and good stability and symmetric strength and tone of shoulders, elbows, wrist, hip, knee and ankles bilaterally.  Right foot exam.  Normal inspection with no visable or palpable  fat pad atrophy and no visible swelling/erythema. Patient is actually nontender today for the first time . Still tender over the flexor pollicis longus muscle Great toe motion: Hallux limitus Arch shape: normal Other foot breakdown: Mild breakdown of the transverse arch with splaying between the first and second toes the Contralateral side unremarkable   Patient was fitted for a : standard, cushioned, semi-rigid orthotic. The orthotic was heated and afterward the patient stood on the orthotic blank positioned on the orthotic stand. The patient was positioned in subtalar neutral position and 10 degrees of ankle dorsiflexion in a weight bearing stance. After completion of molding, a stable base was applied to the orthotic blank. The blank was ground to a stable position for weight bearing. Size:7 Base: [facility] and Padding: None The patient ambulated these, and they were very comfortable.  I spent 45 minutes with this patient, greater than 50% was face-to-face time counseling regarding the below diagnosis.     Impression and Recommendations:

## 2013-10-21 ENCOUNTER — Ambulatory Visit: Payer: 59 | Admitting: Physical Therapy

## 2013-10-21 ENCOUNTER — Encounter: Payer: 59 | Admitting: Physical Therapy

## 2014-09-15 ENCOUNTER — Other Ambulatory Visit (HOSPITAL_COMMUNITY): Payer: Self-pay | Admitting: Family Medicine

## 2014-09-15 DIAGNOSIS — Z1231 Encounter for screening mammogram for malignant neoplasm of breast: Secondary | ICD-10-CM

## 2014-09-15 LAB — HM PAP SMEAR: HM PAP: NEGATIVE

## 2014-10-06 ENCOUNTER — Emergency Department (HOSPITAL_COMMUNITY)
Admission: EM | Admit: 2014-10-06 | Discharge: 2014-10-06 | Disposition: A | Discharge status: Home / Self Care | Payer: 59 | Attending: Emergency Medicine | Admitting: Emergency Medicine

## 2014-10-06 ENCOUNTER — Encounter (HOSPITAL_COMMUNITY): Payer: Self-pay

## 2014-10-06 DIAGNOSIS — N938 Other specified abnormal uterine and vaginal bleeding: Secondary | ICD-10-CM | POA: Diagnosis not present

## 2014-10-06 DIAGNOSIS — K219 Gastro-esophageal reflux disease without esophagitis: Secondary | ICD-10-CM | POA: Diagnosis not present

## 2014-10-06 DIAGNOSIS — Z8659 Personal history of other mental and behavioral disorders: Secondary | ICD-10-CM | POA: Insufficient documentation

## 2014-10-06 DIAGNOSIS — E860 Dehydration: Secondary | ICD-10-CM | POA: Insufficient documentation

## 2014-10-06 DIAGNOSIS — R55 Syncope and collapse: Secondary | ICD-10-CM | POA: Insufficient documentation

## 2014-10-06 DIAGNOSIS — Z720 Tobacco use: Secondary | ICD-10-CM | POA: Insufficient documentation

## 2014-10-06 DIAGNOSIS — Z791 Long term (current) use of non-steroidal anti-inflammatories (NSAID): Secondary | ICD-10-CM | POA: Diagnosis not present

## 2014-10-06 DIAGNOSIS — Z79899 Other long term (current) drug therapy: Secondary | ICD-10-CM | POA: Diagnosis not present

## 2014-10-06 LAB — URINALYSIS, ROUTINE W REFLEX MICROSCOPIC
Bilirubin Urine: NEGATIVE
Glucose, UA: NEGATIVE mg/dL
Leukocytes, UA: NEGATIVE
Nitrite: NEGATIVE
PROTEIN: NEGATIVE mg/dL
Specific Gravity, Urine: >1.03 — ABNORMAL HIGH (ref 1.005–1.030)
Urobilinogen, UA: 0.2 mg/dL (ref 0.0–1.0)
pH: 5.5 (ref 5.0–8.0)

## 2014-10-06 LAB — COMPREHENSIVE METABOLIC PANEL
ALT: 19 U/L (ref 0–35)
ANION GAP: 8 (ref 5–15)
AST: 22 U/L (ref 0–37)
Albumin: 4.1 g/dL (ref 3.5–5.2)
Alkaline Phosphatase: 44 U/L (ref 39–117)
BUN: 15 mg/dL (ref 6–23)
CO2: 23 mmol/L (ref 19–32)
Calcium: 8.8 mg/dL (ref 8.4–10.5)
Chloride: 107 mmol/L (ref 96–112)
Creatinine, Ser: 1.03 mg/dL (ref 0.50–1.10)
GFR calc Af Amer: 74 mL/min — ABNORMAL LOW (ref >90)
GFR calc non Af Amer: 64 mL/min — ABNORMAL LOW (ref >90)
Glucose, Bld: 99 mg/dL (ref 70–99)
Potassium: 3.6 mmol/L (ref 3.5–5.1)
SODIUM: 138 mmol/L (ref 135–145)
TOTAL PROTEIN: 7.3 g/dL (ref 6.0–8.3)
Total Bilirubin: 0.4 mg/dL (ref 0.3–1.2)

## 2014-10-06 LAB — CBC WITH DIFFERENTIAL/PLATELET
Basophils Absolute: 0.1 10*3/uL (ref 0.0–0.1)
Basophils Relative: 1 % (ref 0–1)
EOS ABS: 0.3 10*3/uL (ref 0.0–0.7)
Eosinophils Relative: 3 % (ref 0–5)
HCT: 37.8 % (ref 36.0–46.0)
HEMOGLOBIN: 12.7 g/dL (ref 12.0–15.0)
LYMPHS ABS: 3.1 10*3/uL (ref 0.7–4.0)
Lymphocytes Relative: 31 % (ref 12–46)
MCH: 30.1 pg (ref 26.0–34.0)
MCHC: 33.6 g/dL (ref 30.0–36.0)
MCV: 89.6 fL (ref 78.0–100.0)
MONO ABS: 0.6 10*3/uL (ref 0.1–1.0)
Monocytes Relative: 6 % (ref 3–12)
Neutro Abs: 6 10*3/uL (ref 1.7–7.7)
Neutrophils Relative %: 59 % (ref 43–77)
Platelets: 314 10*3/uL (ref 150–400)
RBC: 4.22 MIL/uL (ref 3.87–5.11)
RDW: 14 % (ref 11.5–15.5)
WBC: 10.1 10*3/uL (ref 4.0–10.5)

## 2014-10-06 LAB — TROPONIN I: Troponin I: <0.03 ng/mL (ref <0.031)

## 2014-10-06 LAB — URINE MICROSCOPIC-ADD ON

## 2014-10-06 MED ORDER — SODIUM CHLORIDE 0.9 % IV SOLN
1000.0000 mL | INTRAVENOUS | Status: DC
  Administered 2014-10-06: 1000 mL via INTRAVENOUS

## 2014-10-06 MED ORDER — SODIUM CHLORIDE 0.9 % IV BOLUS (SEPSIS)
1000.0000 mL | Freq: Once | INTRAVENOUS | Status: AC
  Administered 2014-10-06: 1000 mL via INTRAVENOUS

## 2014-10-06 MED ORDER — SODIUM CHLORIDE 0.9 % IV SOLN
1000.0000 mL | Freq: Once | INTRAVENOUS | Status: AC
  Administered 2014-10-06: 1000 mL via INTRAVENOUS

## 2014-10-06 NOTE — ED Provider Notes (Signed)
CSN: [id number]     Arrival date & time 10/06/14  0200 History   First MD Initiated Contact with Patient 10/06/14 0211     Chief Complaint  Patient presents with  . Near Syncope     (Consider location/radiation/quality/duration/timing/severity/associated sxs/prior Treatment) HPI  Patient reports on April 26 she had a headache that was bitemporal and throbbing. She denies nausea or vomiting but states her vision was "dark". She had photophobia but no noise sensitivity. She states she slept late this morning because she was going to night shift this evening. She got up at 10 AM and was having abdominal cramping but denies nausea, vomiting, or diarrhea. She states she had a normal bowel movement. She states she was placed on birth control pills about 2 weeks ago for heavy periods and severe depression during her periods. She states she took it for 9 days however she stopped it April 23 because it made her more emotional and she was having night sweats. She states she has been bleeding for the past 14 days. Sometimes the bleeding is light and sometimes it's heavy. She states she has not been on birth control pills for 26 years. She states she went back to bed and got up again about 1:30 PM and her headache and abdominal cramping were gone. However about 4 PM the headache returned and she took a naproxen and the headache went away. At 7 PM she went to work but she felt very tired and fatigued. About 1 AM she started sweating profusely and felt hot and felt short of breath. She felt lightheaded like she was going to pass out and states her vision was spotty. She states her left hand got numb. She denies chest pain. She states that lasted about 45 minutes. She states currently she still feels lightheaded even while lying down. She states for the past 3 or 4 days she's been having some intermittent numbness in her hands. She states that has been in both hands but tonight it was just her left hand.  Family  history she has a maternal grandmother who has had a cardiac stent, MI, and a pacemaker.  PCP [facility]  Past Medical History  Diagnosis Date  . Depression   . GERD (gastroesophageal reflux disease)   . Allergy    Past Surgical History  Procedure Laterality Date  . Tubal ligation  12/1988   No family history on file. History  Substance Use Topics  . Smoking status: Current Every Day Smoker  . Smokeless tobacco: Not on file  . Alcohol Use: No   Employed   OB History    No data available     Review of Systems  All other systems reviewed and are negative.     Allergies  Codeine  Home Medications   Prior to Admission medications   Medication Sig Start Date End Date Taking? Authorizing Provider  cetirizine (ZYRTEC) 10 MG tablet Take 10 mg by mouth daily.   Yes Historical Provider, MD  Fish Oil-Cholecalciferol (FISH OIL + D3) 1000-1000 MG-UNIT CAPS Take by mouth.   Yes Historical Provider, MD  omeprazole (PRILOSEC OTC) 20 MG tablet Take 20 mg by mouth daily.   Yes Historical Provider, MD  HYDROcodone-acetaminophen (NORCO) 10-325 MG per tablet Take 1 tablet by mouth every 8 (eight) hours as needed. 08/31/13   Lyndal Pulley, DO  meloxicam (MOBIC) 15 MG tablet Take 1 tablet (15 mg total) by mouth daily. 08/04/13   Lyndal Pulley, DO  nitroGLYCERIN (NITRODUR - DOSED IN MG/24 HR) 0.2 mg/hr patch 1/4 patch daily 08/31/13   Lyndal Pulley, DO   BP 126/87 mmHg  Pulse 86  Temp(Src) 98.1 F (36.7 C) (Oral)  Resp 19  Ht 5\' 7"  (1.702 m)  Wt 197 lb (89.359 kg)  BMI 30.85 kg/m2  SpO2 98%  LMP 09/20/2014  Vital signs normal      02:21 Orthostatic Vital Signs AH  Orthostatic Lying  - BP- Lying: 133/81 mmHg ; Pulse- Lying: 89  Orthostatic Sitting - BP- Sitting: 120/78 mmHg ; Pulse- Sitting: 86  Orthostatic Standing at 0 minutes - BP- Standing at 0 minutes: 123/80 mmHg ; Pulse- Standing at 0 minutes: 88     Orthostatic vital signs are normal   Physical Exam   Constitutional: She is oriented to person, place, and time. She appears well-developed and well-nourished.  Non-toxic appearance. She does not appear ill. No distress.  HENT:  Head: Normocephalic and atraumatic.  Right Ear: External ear normal.  Left Ear: External ear normal.  Nose: Nose normal. No mucosal edema or rhinorrhea.  Mouth/Throat: Mucous membranes are normal. No dental abscesses or uvula swelling.  Tongue is mildly dry  Eyes: Conjunctivae and EOM are normal. Pupils are equal, round, and reactive to light.  Neck: Normal range of motion and full passive range of motion without pain. Neck supple.  Cardiovascular: Normal rate, regular rhythm and normal heart sounds.  Exam reveals no gallop and no friction rub.   No murmur heard. Pulmonary/Chest: Effort normal and breath sounds normal. No respiratory distress. She has no wheezes. She has no rhonchi. She has no rales. She exhibits no tenderness and no crepitus.  Abdominal: Soft. Normal appearance and bowel sounds are normal. She exhibits no distension. There is no tenderness. There is no rebound and no guarding.  Musculoskeletal: Normal range of motion. She exhibits no edema or tenderness.  Moves all extremities well.   Neurological: She is alert and oriented to person, place, and time. She has normal strength. No cranial nerve deficit.  Skin: Skin is warm, dry and intact. No rash noted. No erythema. No pallor.  Psychiatric: Her speech is normal and behavior is normal. Her mood appears not anxious.  Patient has very flat affect  Nursing note and vitals reviewed.   ED Course  Procedures (including critical care time)  Medications  0.9 %  sodium chloride infusion (0 mLs Intravenous Stopped 10/06/14 0545)    Followed by  0.9 %  sodium chloride infusion (0 mLs Intravenous Stopped 10/06/14 0425)    Followed by  0.9 %  sodium chloride infusion (0 mLs Intravenous Stopped 10/06/14 0703)  sodium chloride 0.9 % bolus 1,000 mL (1,000 mLs  Intravenous New Bag/Given 10/06/14 0705)    03:30 pt is finishing her first liter of NS, states ans looks like she is feeling better, no urine output yet. Pt was given her test results.   Recheck 04:40 pt getting her second liter of NS, playing on her cell phone. Now states she isn't feeling any better. Still no urine output  06:05 pt starting her 3rd liter of NS, still no urine output. Pt looks improved. Discussed getting 4th liter if she doesn't have urine output.   07:00 pt starting her 4th liter of IV fluids. She reports she had a small amount of urinary output.  Pt will be discharged after this liter.   Labs Review Results for orders placed or performed during the hospital encounter of 10/06/14  Comprehensive  metabolic panel  Result Value Ref Range   Sodium 138 135 - 145 mmol/L   Potassium 3.6 3.5 - 5.1 mmol/L   Chloride 107 96 - 112 mmol/L   CO2 23 19 - 32 mmol/L   Glucose, Bld 99 70 - 99 mg/dL   BUN 15 6 - 23 mg/dL   Creatinine, Ser 1.03 0.50 - 1.10 mg/dL   Calcium 8.8 8.4 - 10.5 mg/dL   Total Protein 7.3 6.0 - 8.3 g/dL   Albumin 4.1 3.5 - 5.2 g/dL   AST 22 0 - 37 U/L   ALT 19 0 - 35 U/L   Alkaline Phosphatase 44 39 - 117 U/L   Total Bilirubin 0.4 0.3 - 1.2 mg/dL   GFR calc non Af Amer 64 (L) >90 mL/min   GFR calc Af Amer 74 (L) >90 mL/min   Anion gap 8 5 - 15  CBC with Differential  Result Value Ref Range   WBC 10.1 4.0 - 10.5 K/uL   RBC 4.22 3.87 - 5.11 MIL/uL   Hemoglobin 12.7 12.0 - 15.0 g/dL   HCT 37.8 36.0 - 46.0 %   MCV 89.6 78.0 - 100.0 fL   MCH 30.1 26.0 - 34.0 pg   MCHC 33.6 30.0 - 36.0 g/dL   RDW 14.0 11.5 - 15.5 %   Platelets 314 150 - 400 K/uL   Neutrophils Relative % 59 43 - 77 %   Neutro Abs 6.0 1.7 - 7.7 K/uL   Lymphocytes Relative 31 12 - 46 %   Lymphs Abs 3.1 0.7 - 4.0 K/uL   Monocytes Relative 6 3 - 12 %   Monocytes Absolute 0.6 0.1 - 1.0 K/uL   Eosinophils Relative 3 0 - 5 %   Eosinophils Absolute 0.3 0.0 - 0.7 K/uL   Basophils Relative 1 0  - 1 %   Basophils Absolute 0.1 0.0 - 0.1 K/uL  Troponin I  Result Value Ref Range   Troponin I <0.03 <0.031 ng/mL  Urinalysis, Routine w reflex microscopic  Result Value Ref Range   Color, Urine YELLOW YELLOW   APPearance CLEAR CLEAR   Specific Gravity, Urine >1.030 (H) 1.005 - 1.030   pH 5.5 5.0 - 8.0   Glucose, UA NEGATIVE NEGATIVE mg/dL   Hgb urine dipstick TRACE (A) NEGATIVE   Bilirubin Urine NEGATIVE NEGATIVE   Ketones, ur TRACE (A) NEGATIVE mg/dL   Protein, ur NEGATIVE NEGATIVE mg/dL   Urobilinogen, UA 0.2 0.0 - 1.0 mg/dL   Nitrite NEGATIVE NEGATIVE   Leukocytes, UA NEGATIVE NEGATIVE  Urine microscopic-add on  Result Value Ref Range   Squamous Epithelial / LPF MANY (A) RARE   RBC / HPF 0-2 <3 RBC/hpf   Laboratory interpretation all normal     Imaging Review No results found.   EKG Interpretation   Date/Time:  Thursday October 06 2014 02:14:07 EDT Ventricular Rate:  91 PR Interval:  140 QRS Duration: 81 QT Interval:  375 QTC Calculation: 461 R Axis:   31 Text Interpretation:  Sinus rhythm Abnormal R-wave progression, early  transition No old tracing to compare Confirmed by Gionni Freese  MD-I, Elizabella Nolet ([id number])  on 10/06/2014 2:26:36 AM      MDM   Final diagnoses:  Dehydration  Near syncope  DUB (dysfunctional uterine bleeding)    Plan discharge  Rolland Porter, MD, Barbette Or, MD 10/06/14 [phone]

## 2014-10-06 NOTE — ED Notes (Signed)
EKG done and given to EDP 

## 2014-10-06 NOTE — Discharge Instructions (Signed)
Try to drink plenty of fluids. Go home and rest. Have your doctor recheck you if you aren't feeling better in the next several days.    Dehydration, Adult Dehydration means your body does not have as much fluid as it needs. Your kidneys, brain, and heart will not work properly without the right amount of fluids and salt.  HOME CARE  Ask your doctor how to replace body fluid losses (rehydrate).  Drink enough fluids to keep your pee (urine) clear or pale yellow.  Drink small amounts of fluids often if you feel sick to your stomach (nauseous) or throw up (vomit).  Eat like you normally do.  Avoid:  Foods or drinks high in sugar.  Bubbly (carbonated) drinks.  Juice.  Very hot or cold fluids.  Drinks with caffeine.  Fatty, greasy foods.  Alcohol.  Tobacco.  Eating too much.  Gelatin desserts.  Wash your hands to avoid spreading germs (bacteria, viruses).  Only take medicine as told by your doctor.  Keep all doctor visits as told. GET HELP RIGHT AWAY IF:   You cannot drink something without throwing up.  You get worse even with treatment.  Your vomit has blood in it or looks greenish.  Your poop (stool) has blood in it or looks black and tarry.  You have not peed in 6 to 8 hours.  You pee a small amount of very dark pee.  You have a fever.  You pass out (faint).  You have belly (abdominal) pain that gets worse or stays in one spot (localizes).  You have a rash, stiff neck, or bad headache.  You get easily annoyed, sleepy, or are hard to wake up.  You feel weak, dizzy, or very thirsty. MAKE SURE YOU:   Understand these instructions.  Will watch your condition.  Will get help right away if you are not doing well or get worse. Document Released: 03/23/2009 Document Revised: 08/19/2011 Document Reviewed: 01/14/2011 [facility] Patient Information 2015 [place], [place]. This information is not intended to replace advice given to you by your health care  provider. Make sure you discuss any questions you have with your health care provider.

## 2014-10-06 NOTE — ED Notes (Signed)
Pt states she was at work tonight, and while walking she suddenly felt lightheaded and weak, states she felt like she was going to pass out, got real hot and sweaty.

## 2014-10-06 NOTE — ED Notes (Signed)
Patient with no complaints at this time. Respirations even and unlabored. Skin warm/dry. Discharge instructions reviewed with patient at this time. Patient given opportunity to voice concerns/ask questions. IV removed per policy and band-aid applied to site. Patient discharged at this time and left Emergency Department with steady gait.  

## 2014-10-07 ENCOUNTER — Emergency Department (HOSPITAL_COMMUNITY)
Admission: EM | Admit: 2014-10-07 | Discharge: 2014-10-07 | Disposition: A | Discharge status: Home / Self Care | Payer: 59 | Attending: Emergency Medicine | Admitting: Emergency Medicine

## 2014-10-07 ENCOUNTER — Encounter (HOSPITAL_COMMUNITY): Payer: Self-pay | Admitting: *Deleted

## 2014-10-07 DIAGNOSIS — Z720 Tobacco use: Secondary | ICD-10-CM | POA: Insufficient documentation

## 2014-10-07 DIAGNOSIS — Z9851 Tubal ligation status: Secondary | ICD-10-CM | POA: Insufficient documentation

## 2014-10-07 DIAGNOSIS — R51 Headache: Secondary | ICD-10-CM | POA: Insufficient documentation

## 2014-10-07 DIAGNOSIS — K219 Gastro-esophageal reflux disease without esophagitis: Secondary | ICD-10-CM | POA: Insufficient documentation

## 2014-10-07 DIAGNOSIS — Z8739 Personal history of other diseases of the musculoskeletal system and connective tissue: Secondary | ICD-10-CM | POA: Insufficient documentation

## 2014-10-07 DIAGNOSIS — Z79899 Other long term (current) drug therapy: Secondary | ICD-10-CM | POA: Insufficient documentation

## 2014-10-07 DIAGNOSIS — Z791 Long term (current) use of non-steroidal anti-inflammatories (NSAID): Secondary | ICD-10-CM | POA: Insufficient documentation

## 2014-10-07 DIAGNOSIS — N939 Abnormal uterine and vaginal bleeding, unspecified: Secondary | ICD-10-CM

## 2014-10-07 DIAGNOSIS — Z8659 Personal history of other mental and behavioral disorders: Secondary | ICD-10-CM | POA: Insufficient documentation

## 2014-10-07 HISTORY — DX: Plantar fascial fibromatosis: M72.2

## 2014-10-07 LAB — BASIC METABOLIC PANEL
Anion gap: 7 (ref 5–15)
BUN: 7 mg/dL (ref 6–23)
CO2: 24 mmol/L (ref 19–32)
Calcium: 8.8 mg/dL (ref 8.4–10.5)
Chloride: 108 mmol/L (ref 96–112)
Creatinine, Ser: 0.76 mg/dL (ref 0.50–1.10)
GFR calc Af Amer: >90 mL/min (ref >90)
GFR calc non Af Amer: >90 mL/min (ref >90)
Glucose, Bld: 92 mg/dL (ref 70–99)
Potassium: 3.7 mmol/L (ref 3.5–5.1)
Sodium: 139 mmol/L (ref 135–145)

## 2014-10-07 LAB — CBC WITH DIFFERENTIAL/PLATELET
BASOS ABS: 0 10*3/uL (ref 0.0–0.1)
Basophils Relative: 0 % (ref 0–1)
Eosinophils Absolute: 0.3 10*3/uL (ref 0.0–0.7)
Eosinophils Relative: 4 % (ref 0–5)
HCT: 38.2 % (ref 36.0–46.0)
Hemoglobin: 12.7 g/dL (ref 12.0–15.0)
LYMPHS ABS: 2 10*3/uL (ref 0.7–4.0)
Lymphocytes Relative: 30 % (ref 12–46)
MCH: 30 pg (ref 26.0–34.0)
MCHC: 33.2 g/dL (ref 30.0–36.0)
MCV: 90.3 fL (ref 78.0–100.0)
Monocytes Absolute: 0.4 10*3/uL (ref 0.1–1.0)
Monocytes Relative: 6 % (ref 3–12)
NEUTROS ABS: 4 10*3/uL (ref 1.7–7.7)
NEUTROS PCT: 60 % (ref 43–77)
PLATELETS: 290 10*3/uL (ref 150–400)
RBC: 4.23 MIL/uL (ref 3.87–5.11)
RDW: 14 % (ref 11.5–15.5)
WBC: 6.7 10*3/uL (ref 4.0–10.5)

## 2014-10-07 LAB — WET PREP, GENITAL
CLUE CELLS WET PREP: NONE SEEN
TRICH WET PREP: NONE SEEN
Yeast Wet Prep HPF POC: NONE SEEN

## 2014-10-07 MED ORDER — ACETAMINOPHEN 500 MG PO TABS
1000.0000 mg | ORAL_TABLET | Freq: Once | ORAL | Status: AC
  Administered 2014-10-07: 1000 mg via ORAL
  Filled 2014-10-07: qty 2

## 2014-10-07 NOTE — ED Provider Notes (Signed)
CSN: [id number]     Arrival date & time 10/07/14  1548 History   First MD Initiated Contact with Patient 10/07/14 1632     Chief Complaint  Patient presents with  . Vaginal Bleeding     (Consider location/radiation/quality/duration/timing/severity/associated sxs/prior Treatment) Patient is a 49 y.o. female presenting with vaginal bleeding. The history is provided by the patient.  Vaginal Bleeding Associated symptoms: no abdominal pain, no back pain, no dysuria, no fever, no nausea and no vaginal discharge    patient with a history of vaginal bleeding sometimes heavy and sometimes light. Currently. Ongoing for 2 weeks. Patient was on birth control pills to help control this by primary care doctor. That was not helping and has stopped them. Patient is been followed [facility] past. Patient was seen here on April 28 for dehydration. Had labs at that time. Patient was told to come here by [place]. Patient without any new or worse symptoms. In fact the vaginal bleeding is less today than it has been in the past. Denies any pelvic pain or back pain. Denies any lightheadedness or dizziness or feeling like she will pass out.  Past Medical History  Diagnosis Date  . Depression   . GERD (gastroesophageal reflux disease)   . Allergy   . Plantar fasciitis    Past Surgical History  Procedure Laterality Date  . Tubal ligation  12/1988   History reviewed. No pertinent family history. History  Substance Use Topics  . Smoking status: Current Every Day Smoker  . Smokeless tobacco: Not on file  . Alcohol Use: No   OB History    No data available     Review of Systems  Constitutional: Negative for fever.  HENT: Negative for congestion.   Eyes: Negative for visual disturbance.  Respiratory: Negative for shortness of breath.   Cardiovascular: Negative for chest pain.  Gastrointestinal: Negative for nausea, vomiting and abdominal pain.  Genitourinary: Positive for  vaginal bleeding. Negative for dysuria and vaginal discharge.  Musculoskeletal: Negative for back pain.  Skin: Negative for rash.  Neurological: Positive for headaches.  Hematological: Does not bruise/bleed easily.  Psychiatric/Behavioral: Negative for confusion.      Allergies  Codeine  Home Medications   Prior to Admission medications   Medication Sig Start Date End Date Taking? Authorizing Provider  cetirizine (ZYRTEC) 10 MG tablet Take 10 mg by mouth daily.    Historical Provider, MD  Fish Oil-Cholecalciferol (FISH OIL + D3) 1000-1000 MG-UNIT CAPS Take by mouth.    Historical Provider, MD  HYDROcodone-acetaminophen (NORCO) 10-325 MG per tablet Take 1 tablet by mouth every 8 (eight) hours as needed. 08/31/13   Lyndal Pulley, DO  meloxicam (MOBIC) 15 MG tablet Take 1 tablet (15 mg total) by mouth daily. 08/04/13   Lyndal Pulley, DO  nitroGLYCERIN (NITRODUR - DOSED IN MG/24 HR) 0.2 mg/hr patch 1/4 patch daily 08/31/13   Lyndal Pulley, DO  omeprazole (PRILOSEC OTC) 20 MG tablet Take 20 mg by mouth daily.    Historical Provider, MD   BP 123/69 mmHg  Pulse 80  Temp(Src) 99.1 F (37.3 C) (Oral)  Resp 16  Ht 5\' 7"  (1.702 m)  Wt 197 lb (89.359 kg)  BMI 30.85 kg/m2  SpO2 100%  LMP 09/20/2014 Physical Exam  Constitutional: She is oriented to person, place, and time. She appears well-developed and well-nourished. No distress.  HENT:  Head: Normocephalic and atraumatic.  Mouth/Throat: Oropharynx is clear and moist.  Eyes:  Conjunctivae are normal. Pupils are equal, round, and reactive to light.  Neck: Normal range of motion.  Cardiovascular: Normal rate, regular rhythm and normal heart sounds.   No murmur heard. Pulmonary/Chest: Effort normal and breath sounds normal.  Abdominal: Soft. Bowel sounds are normal. There is no tenderness.  Genitourinary: Vagina normal and uterus normal. No vaginal discharge found.  Normal external genitalia. Little bit of a scant blood in the  vaginal vault. No clots. No cervical motion tenderness. No uterine tenderness no adnexal tenderness.  Musculoskeletal: Normal range of motion.  Neurological: She is alert and oriented to person, place, and time. No cranial nerve deficit. She exhibits normal muscle tone. Coordination normal.  Skin: Skin is warm. No rash noted.  Nursing note and vitals reviewed.   ED Course  Procedures (including critical care time) Labs Review Labs Reviewed  WET PREP, GENITAL - Abnormal; Notable for the following:    WBC, Wet Prep HPF POC FEW (*)    All other components within normal limits  CBC WITH DIFFERENTIAL/PLATELET  BASIC METABOLIC PANEL  HIV ANTIBODY (ROUTINE TESTING)  GC/CHLAMYDIA PROBE AMP ([place])    Imaging Review No results found.   EKG Interpretation None      MDM   Final diagnoses:  Abnormal vaginal bleeding    Very important to follow-up with OB/GYN for the abnormal vaginal bleeding. Today's lab work without any significant changes [place] comparison to the lab work from yesterday.    Fredia Sorrow, MD 10/07/14 2019

## 2014-10-07 NOTE — ED Notes (Signed)
Pt states has had vaginal bleeding for 15 days after PCP placed pt on birth control medicine. Pt has had weakness and sweats. Pt seen in ED yesterday and treated for dehydration. Pt told by Dr. Hilma Favors to come to ED.

## 2014-10-07 NOTE — Discharge Instructions (Signed)
As we discussed follow-up with [facility]. Today's lab workup without any significant change compared to yesterday. Return for any new or worse symptoms.

## 2014-10-09 LAB — HIV ANTIBODY (ROUTINE TESTING W REFLEX): HIV SCREEN 4TH GENERATION: NONREACTIVE

## 2014-10-10 LAB — GC/CHLAMYDIA PROBE AMP (~~LOC~~) NOT AT ARMC
Chlamydia: NEGATIVE
Neisseria Gonorrhea: NEGATIVE

## 2014-10-17 ENCOUNTER — Encounter: Payer: Self-pay | Admitting: *Deleted

## 2014-10-17 NOTE — Progress Notes (Signed)
Pt being referred to our office to discuss Endometrial Ablation.

## 2014-10-18 ENCOUNTER — Encounter: Payer: Self-pay | Admitting: Obstetrics and Gynecology

## 2014-10-18 ENCOUNTER — Ambulatory Visit (INDEPENDENT_AMBULATORY_CARE_PROVIDER_SITE_OTHER): Payer: 59 | Admitting: Obstetrics and Gynecology

## 2014-10-18 VITALS — BP 136/78 | Ht 67.0 in | Wt 194.0 lb

## 2014-10-18 DIAGNOSIS — N920 Excessive and frequent menstruation with regular cycle: Secondary | ICD-10-CM

## 2014-10-18 NOTE — Progress Notes (Signed)
Patient ID: Lorraine Kelly, female   DOB: 19-Aug-1965, 49 y.o.   MRN: [id number] Pt here today to discuss endometrial ablation. Pt states that she has always had long periods that she also has terrible headaches, depression, and her whole body hurts. Pt states that she when she was put on BCP she the bled the whole time she took it and for for days after stopping it. Pt states that she was out of sorts and felt terrible.    [facility] Visit  Patient name: Lorraine Kelly MRN [id number]  Date of birth: 1966/01/19  CC & HPI:  Lorraine Kelly is a 49 y.o. female presenting today for discuss ablation, also pt c/o moodiness x 3-4 days before menses noted x 31yrs pt "doesn't have her own brain' x a few days arund menses  ROS:  No u/s eval to date,  Pap normal 09/2014   Pertinent History Reviewed:   Reviewed: Significant for  Medical         Past Medical History  Diagnosis Date  . Depression   . GERD (gastroesophageal reflux disease)   . Allergy   . Plantar fasciitis                               Surgical Hx:    Past Surgical History  Procedure Laterality Date  . Tubal ligation  12/1988  FAM HX; + FOR DAUGHTER WITH PCOS  Medications: Reviewed & Updated - see associated section                       Current outpatient prescriptions:  .  cetirizine (ZYRTEC) 10 MG tablet, Take 10 mg by mouth daily., Disp: , Rfl:  .  Fish Oil-Cholecalciferol (FISH OIL + D3) 1000-1000 MG-UNIT CAPS, Take by mouth., Disp: , Rfl:  .  omeprazole (PRILOSEC OTC) 20 MG tablet, Take 20 mg by mouth daily., Disp: , Rfl:  .  CHANTIX CONTINUING MONTH PAK 1 MG tablet, , Disp: , Rfl:    Social History: Reviewed -  reports that she has been smoking Cigarettes.  She has a 13.5 pack-year smoking history. She has never used smokeless tobacco.  Objective Findings:  Vitals: Blood pressure 136/78, height 5\' 7"  (1.702 m), weight 194 lb (87.998 kg), last menstrual period 09/20/2014.  Physical Examination: General appearance  - alert, well appearing, and in no distress, oriented to person, place, and time and overweight Mental status - alert, oriented to person, place, and time, normal mood, behavior, speech, dress, motor activity, and thought processes   Assessment & Plan:   A:  1. PMMD 2, desires menses removal 3. S/p BTL  P:  1. Pelvic u/s  2. endometrial biopsy 3 proceed toward endometrial biopsy

## 2014-10-19 ENCOUNTER — Ambulatory Visit (HOSPITAL_COMMUNITY): Payer: Self-pay

## 2014-10-27 ENCOUNTER — Other Ambulatory Visit: Payer: Self-pay | Admitting: Obstetrics and Gynecology

## 2014-10-27 ENCOUNTER — Ambulatory Visit (INDEPENDENT_AMBULATORY_CARE_PROVIDER_SITE_OTHER): Payer: 59 | Admitting: Obstetrics and Gynecology

## 2014-10-27 ENCOUNTER — Ambulatory Visit (INDEPENDENT_AMBULATORY_CARE_PROVIDER_SITE_OTHER): Payer: 59

## 2014-10-27 ENCOUNTER — Encounter: Payer: Self-pay | Admitting: Obstetrics and Gynecology

## 2014-10-27 VITALS — BP 130/90 | Ht 67.0 in

## 2014-10-27 DIAGNOSIS — Z3202 Encounter for pregnancy test, result negative: Secondary | ICD-10-CM | POA: Diagnosis not present

## 2014-10-27 DIAGNOSIS — N920 Excessive and frequent menstruation with regular cycle: Secondary | ICD-10-CM

## 2014-10-27 DIAGNOSIS — Z01818 Encounter for other preprocedural examination: Secondary | ICD-10-CM | POA: Diagnosis not present

## 2014-10-27 DIAGNOSIS — Z3200 Encounter for pregnancy test, result unknown: Secondary | ICD-10-CM

## 2014-10-27 LAB — POCT URINE PREGNANCY: PREG TEST UR: NEGATIVE

## 2014-10-27 MED ORDER — MEDROXYPROGESTERONE ACETATE 5 MG PO TABS: 5.0000 mg | ORAL_TABLET | Freq: Every day | ORAL | Status: DC

## 2014-10-27 NOTE — Progress Notes (Signed)
Patient ID: Lorraine Kelly, female   DOB: [birth year], 49 y.o.   MRN: [id number] Pt here today for [place] and then endometrial biopsy. UPT is negative today.

## 2014-10-27 NOTE — Progress Notes (Signed)
Patient ID: Lorraine Kelly, female   DOB: 10/22/1965, 49 y.o.   MRN: [id number]  HPI Comments: Patient states she is having continuing cramping, although that has improved somewhat. She denies any pain with intercourse. Patient has a history of BTL.   Endometrial Biopsy: Patient given informed consent, signed copy in the chart, time out was performed. Time out taken. The patient was placed in the lithotomy position and the cervix brought into view with sterile speculum.  Portio of cervix cleansed x 2 with betadine swabs.  A tenaculum was placed in the anterior lip of the cervix. The uterus was sounded for depth of 9 cm,. Milex uterine Explora 3 mm was introduced to into the uterus, suction created,  and an endometrial sample was obtained. All equipment was removed and accounted for.   The patient tolerated the procedure well.    Patient given post procedure instructions.  Followup: scheduled for endometrial ablation in 11 days. Suppress endometrium with provera 5 q d.  This chart was SCRIBED for Mallory Shirk, MD by Stephania Fragmin, ED Scribe. This patient was seen in room 2 and the patient's care was started at 4:30 PM.  I personally performed the services described in this documentation, which was SCRIBED in my presence. The recorded information has been reviewed and considered accurate. It has been edited as necessary during review. Jonnie Kind, MD

## 2014-10-27 NOTE — Progress Notes (Signed)
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

## 2014-11-03 ENCOUNTER — Other Ambulatory Visit: Payer: Self-pay | Admitting: Obstetrics & Gynecology

## 2014-11-04 ENCOUNTER — Encounter (HOSPITAL_COMMUNITY): Admission: RE | Admit: 2014-11-04 | Payer: 59 | Source: Ambulatory Visit

## 2014-11-04 ENCOUNTER — Ambulatory Visit (HOSPITAL_COMMUNITY)
Admission: RE | Admit: 2014-11-04 | Discharge: 2014-11-04 | Disposition: A | Discharge status: Home / Self Care | Payer: 59 | Source: Ambulatory Visit | Attending: Family Medicine | Admitting: Family Medicine

## 2014-11-04 DIAGNOSIS — Z1231 Encounter for screening mammogram for malignant neoplasm of breast: Secondary | ICD-10-CM | POA: Diagnosis present

## 2014-11-11 ENCOUNTER — Telehealth: Payer: Self-pay | Admitting: Obstetrics and Gynecology

## 2014-11-11 NOTE — Telephone Encounter (Signed)
-----   Message from Faith sent at 11/04/2014 10:39 AM EDT ----- Regarding: pt allergic to anesthesia Contact: [phone] Pt wanted to make sure you noted that she is allergic to anesthesia.  She was under the impression that she would not be "put under" when you do her Ablation on 11/17/14.  Please call her to reassure her what will happen due to this allergy.  She said if she does not answer due to her work schedule, please leave her a Dance movement psychotherapist.

## 2014-11-11 NOTE — Telephone Encounter (Signed)
Message left for patient to call me this weekend so I can pursue the proper consultations with Anesthesia to assess her "allergy " to anesthesia.

## 2014-11-11 NOTE — Telephone Encounter (Signed)
-----   Message from [place] sent at 11/04/2014 10:39 AM EDT ----- Regarding: pt allergic to anesthesia Contact: [phone] Pt wanted to make sure you noted that she is allergic to anesthesia.  She was under the impression that she would not be "put under" when you do her Ablation on 11/17/14.  Please call her to reassure her what will happen due to this allergy.  She said if she does not answer due to her work schedule, please leave her a Dance movement psychotherapist.

## 2014-11-14 ENCOUNTER — Encounter (HOSPITAL_COMMUNITY)
Admission: RE | Admit: 2014-11-14 | Discharge: 2014-11-14 | Disposition: A | Discharge status: Home / Self Care | Payer: 59 | Source: Ambulatory Visit | Attending: Obstetrics and Gynecology | Admitting: Obstetrics and Gynecology

## 2014-11-14 ENCOUNTER — Encounter (HOSPITAL_COMMUNITY): Payer: 59

## 2014-11-14 ENCOUNTER — Other Ambulatory Visit: Payer: Self-pay | Admitting: Obstetrics and Gynecology

## 2014-11-14 ENCOUNTER — Other Ambulatory Visit (HOSPITAL_COMMUNITY): Payer: 59

## 2014-11-14 ENCOUNTER — Encounter (HOSPITAL_COMMUNITY): Payer: Self-pay

## 2014-11-14 DIAGNOSIS — Z79899 Other long term (current) drug therapy: Secondary | ICD-10-CM | POA: Diagnosis not present

## 2014-11-14 DIAGNOSIS — N946 Dysmenorrhea, unspecified: Secondary | ICD-10-CM | POA: Diagnosis not present

## 2014-11-14 DIAGNOSIS — K219 Gastro-esophageal reflux disease without esophagitis: Secondary | ICD-10-CM | POA: Diagnosis not present

## 2014-11-14 DIAGNOSIS — F17200 Nicotine dependence, unspecified, uncomplicated: Secondary | ICD-10-CM | POA: Diagnosis not present

## 2014-11-14 DIAGNOSIS — N920 Excessive and frequent menstruation with regular cycle: Secondary | ICD-10-CM | POA: Diagnosis present

## 2014-11-14 HISTORY — DX: Other complications of anesthesia, initial encounter: T88.59XA

## 2014-11-14 HISTORY — DX: Other specified postprocedural states: R11.2

## 2014-11-14 HISTORY — DX: Adverse effect of unspecified anesthetic, initial encounter: T41.45XA

## 2014-11-14 HISTORY — DX: Other specified postprocedural states: Z98.890

## 2014-11-14 LAB — URINALYSIS, ROUTINE W REFLEX MICROSCOPIC
BILIRUBIN URINE: NEGATIVE
GLUCOSE, UA: NEGATIVE mg/dL
KETONES UR: NEGATIVE mg/dL
LEUKOCYTES UA: NEGATIVE
Nitrite: NEGATIVE
PH: 5.5 (ref 5.0–8.0)
Protein, ur: NEGATIVE mg/dL
Specific Gravity, Urine: <1.005 — ABNORMAL LOW (ref 1.005–1.030)
Urobilinogen, UA: 0.2 mg/dL (ref 0.0–1.0)

## 2014-11-14 LAB — COMPREHENSIVE METABOLIC PANEL
ALT: 16 U/L (ref 14–54)
ANION GAP: 10 (ref 5–15)
AST: 17 U/L (ref 15–41)
Albumin: 3.8 g/dL (ref 3.5–5.0)
Alkaline Phosphatase: 65 U/L (ref 38–126)
BUN: 12 mg/dL (ref 6–20)
CO2: 25 mmol/L (ref 22–32)
CREATININE: 0.85 mg/dL (ref 0.44–1.00)
Calcium: 9.1 mg/dL (ref 8.9–10.3)
Chloride: 101 mmol/L (ref 101–111)
GFR calc non Af Amer: >60 mL/min (ref >60)
Glucose, Bld: 87 mg/dL (ref 65–99)
POTASSIUM: 4.2 mmol/L (ref 3.5–5.1)
Sodium: 136 mmol/L (ref 135–145)
TOTAL PROTEIN: 6.6 g/dL (ref 6.5–8.1)
Total Bilirubin: 0.3 mg/dL (ref 0.3–1.2)

## 2014-11-14 LAB — HCG, QUANTITATIVE, PREGNANCY

## 2014-11-14 LAB — CBC
HCT: 35.9 % — ABNORMAL LOW (ref 36.0–46.0)
Hemoglobin: 11.9 g/dL — ABNORMAL LOW (ref 12.0–15.0)
MCH: 29.9 pg (ref 26.0–34.0)
MCHC: 33.1 g/dL (ref 30.0–36.0)
MCV: 90.2 fL (ref 78.0–100.0)
Platelets: 293 10*3/uL (ref 150–400)
RBC: 3.98 MIL/uL (ref 3.87–5.11)
RDW: 13.7 % (ref 11.5–15.5)
WBC: 9.9 10*3/uL (ref 4.0–10.5)

## 2014-11-14 LAB — URINE MICROSCOPIC-ADD ON

## 2014-11-14 NOTE — Patient Instructions (Signed)
Lorraine Kelly  11/14/2014     @PREFPERIOPPHARMACY @   Your procedure is scheduled on 11/17/2014.  Report to [name] at 6:15 A.M.  Call this number if you have problems the morning of surgery:  [phone]   Remember:  Do not eat food or drink liquids after midnight.  Take these medicines the morning of surgery with A SIP OF WATER Prilosec and Zyrtec   Do not wear jewelry, make-up or nail polish.  Do not wear lotions, powders, or perfumes.  You may wear deodorant.  Do not shave 48 hours prior to surgery.  Men may shave face and neck.  Do not bring valuables to the hospital.  [facility] is not responsible for any belongings or valuables.  Contacts, dentures or bridgework may not be worn into surgery.  Leave your suitcase in the car.  After surgery it may be brought to your room.  For patients admitted to the hospital, discharge time will be determined by your treatment team.  Patients discharged the day of surgery will not be allowed to drive home.   Name and phone number of your driver:   family Special instructions:  [facility]   Hysteroscopy Hysteroscopy is a procedure used for looking inside the womb (uterus). It may be done for various reasons, including:  To evaluate abnormal bleeding, fibroid (benign, noncancerous) tumors, polyps, scar tissue (adhesions), and possibly cancer of the uterus.  To look for lumps (tumors) and other uterine growths.  To look for causes of why a woman cannot get pregnant (infertility), causes of recurrent loss of pregnancy (miscarriages), or a lost intrauterine device (IUD).  To perform a sterilization by blocking the fallopian tubes from inside the uterus. In this procedure, a thin, flexible tube with a tiny light and camera on the end of it (hysteroscope) is used to look inside the uterus. A hysteroscopy should be done right after a menstrual period to be sure you are not pregnant. LET [facility] CARE PROVIDER KNOW ABOUT:   Any allergies you  have.  All medicines you are taking, including vitamins, herbs, eye drops, creams, and over-the-counter medicines.  Previous problems you or members of your family have had with the use of anesthetics.  Any blood disorders you have.  Previous surgeries you have had.  Medical conditions you have. RISKS AND COMPLICATIONS  Generally, this is a safe procedure. However, as with any procedure, complications can occur. Possible complications include:  Putting a hole in the uterus.  Excessive bleeding.  Infection.  Damage to the cervix.  Injury to other organs.  Allergic reaction to medicines.  Too much fluid used in the uterus for the procedure. BEFORE THE PROCEDURE   Ask your health care provider about changing or stopping any regular medicines.  Do not take aspirin or blood thinners for 1 week before the procedure, or as directed by your health care provider. These can cause bleeding.  If you smoke, do not smoke for 2 weeks before the procedure.  In some cases, a medicine is placed in the cervix the day before the procedure. This medicine makes the cervix have a larger opening (dilate). This makes it easier for the instrument to be inserted into the uterus during the procedure.  Do not eat or drink anything for at least 8 hours before the surgery.  Arrange for someone to take you home after the procedure. PROCEDURE   You may be given a medicine to relax you (sedative). You may also be given  one of the following:  A medicine that numbs the area around the cervix (local anesthetic).  A medicine that makes you sleep through the procedure (general anesthetic).  The hysteroscope is inserted through the vagina into the uterus. The camera on the hysteroscope sends a picture to a TV screen. This gives the surgeon a good view inside the uterus.  During the procedure, air or a liquid is put into the uterus, which allows the surgeon to see better.  Sometimes, tissue is gently  scraped from inside the uterus. These tissue samples are sent to a lab for testing. AFTER THE PROCEDURE   If you had a general anesthetic, you may be groggy for a couple hours after the procedure.  If you had a local anesthetic, you will be able to go home as soon as you are stable and feel ready.  You may have some cramping. This normally lasts for a couple days.  You may have bleeding, which varies from light spotting for a few days to menstrual-like bleeding for 3-7 days. This is normal.  If your test results are not back during the visit, make an appointment with your health care provider to find out the results. Document Released: 09/02/2000 Document Revised: 03/17/2013 Document Reviewed: 12/24/2012 [facility] Patient Information 2015 [place], [place]. This information is not intended to replace advice given to you by your health care provider. Make sure you discuss any questions you have with your health care provider.   Please read over the following fact sheets that you were given. Care and Recovery After Surgery

## 2014-11-17 ENCOUNTER — Encounter (HOSPITAL_COMMUNITY): Payer: Self-pay | Admitting: *Deleted

## 2014-11-17 ENCOUNTER — Ambulatory Visit (HOSPITAL_COMMUNITY): Payer: 59 | Admitting: Anesthesiology

## 2014-11-17 ENCOUNTER — Encounter: Payer: 59 | Admitting: Obstetrics and Gynecology

## 2014-11-17 ENCOUNTER — Ambulatory Visit (HOSPITAL_COMMUNITY)
Admission: RE | Admit: 2014-11-17 | Discharge: 2014-11-17 | Disposition: A | Discharge status: Home / Self Care | Payer: 59 | Source: Ambulatory Visit | Attending: Obstetrics and Gynecology | Admitting: Obstetrics and Gynecology

## 2014-11-17 ENCOUNTER — Encounter (HOSPITAL_COMMUNITY)
Admission: RE | Disposition: A | Discharge status: Home / Self Care | Payer: Self-pay | Source: Ambulatory Visit | Attending: Obstetrics and Gynecology

## 2014-11-17 DIAGNOSIS — Z79899 Other long term (current) drug therapy: Secondary | ICD-10-CM | POA: Insufficient documentation

## 2014-11-17 DIAGNOSIS — K219 Gastro-esophageal reflux disease without esophagitis: Secondary | ICD-10-CM | POA: Insufficient documentation

## 2014-11-17 DIAGNOSIS — N946 Dysmenorrhea, unspecified: Secondary | ICD-10-CM | POA: Insufficient documentation

## 2014-11-17 DIAGNOSIS — F17200 Nicotine dependence, unspecified, uncomplicated: Secondary | ICD-10-CM | POA: Insufficient documentation

## 2014-11-17 DIAGNOSIS — N920 Excessive and frequent menstruation with regular cycle: Secondary | ICD-10-CM | POA: Diagnosis not present

## 2014-11-17 DIAGNOSIS — N943 Premenstrual tension syndrome: Secondary | ICD-10-CM | POA: Insufficient documentation

## 2014-11-17 HISTORY — PX: DILITATION & CURRETTAGE/HYSTROSCOPY WITH NOVASURE ABLATION: SHX5568

## 2014-11-17 SURGERY — DILATATION & CURETTAGE/HYSTEROSCOPY WITH NOVASURE ABLATION
Anesthesia: General

## 2014-11-17 MED ORDER — ONDANSETRON HCL 4 MG/2ML IJ SOLN
4.0000 mg | Freq: Once | INTRAMUSCULAR | Status: AC
Start: 2014-11-17 — End: 2014-11-17
  Administered 2014-11-17: 4 mg via INTRAVENOUS

## 2014-11-17 MED ORDER — MIDAZOLAM HCL 2 MG/2ML IJ SOLN
INTRAMUSCULAR | Status: AC
  Filled 2014-11-17: qty 2

## 2014-11-17 MED ORDER — SEVOFLURANE IN SOLN
RESPIRATORY_TRACT | Status: AC
  Filled 2014-11-17: qty 250

## 2014-11-17 MED ORDER — TRAMADOL HCL 50 MG PO TABS: 50.0000 mg | ORAL_TABLET | Freq: Four times a day (QID) | ORAL | Status: DC | PRN

## 2014-11-17 MED ORDER — EPHEDRINE SULFATE 50 MG/ML IJ SOLN
INTRAMUSCULAR | Status: DC | PRN
  Administered 2014-11-17 (×2): 5 mg via INTRAVENOUS

## 2014-11-17 MED ORDER — GLYCOPYRROLATE 0.2 MG/ML IJ SOLN
INTRAMUSCULAR | Status: AC
  Filled 2014-11-17: qty 1

## 2014-11-17 MED ORDER — KETOROLAC TROMETHAMINE 30 MG/ML IJ SOLN
30.0000 mg | Freq: Once | INTRAMUSCULAR | Status: AC
Start: 2014-11-17 — End: 2014-11-17
  Administered 2014-11-17: 30 mg via INTRAVENOUS
  Filled 2014-11-17: qty 1

## 2014-11-17 MED ORDER — ONDANSETRON HCL 4 MG/2ML IJ SOLN
INTRAMUSCULAR | Status: AC
  Filled 2014-11-17: qty 2

## 2014-11-17 MED ORDER — FENTANYL CITRATE (PF) 100 MCG/2ML IJ SOLN
INTRAMUSCULAR | Status: AC
  Filled 2014-11-17: qty 2

## 2014-11-17 MED ORDER — SODIUM CHLORIDE 0.9 % IR SOLN
Status: DC | PRN
  Administered 2014-11-17: 3000 mL

## 2014-11-17 MED ORDER — FENTANYL CITRATE (PF) 100 MCG/2ML IJ SOLN
INTRAMUSCULAR | Status: DC | PRN
  Administered 2014-11-17: 25 ug via INTRAVENOUS
  Administered 2014-11-17: 50 ug via INTRAVENOUS
  Administered 2014-11-17: 25 ug via INTRAVENOUS

## 2014-11-17 MED ORDER — KETOROLAC TROMETHAMINE 10 MG PO TABS: 10.0000 mg | ORAL_TABLET | Freq: Four times a day (QID) | ORAL | Status: DC | PRN

## 2014-11-17 MED ORDER — SUCCINYLCHOLINE CHLORIDE 20 MG/ML IJ SOLN
INTRAMUSCULAR | Status: AC
  Filled 2014-11-17: qty 1

## 2014-11-17 MED ORDER — DEXAMETHASONE SODIUM PHOSPHATE 4 MG/ML IJ SOLN
4.0000 mg | Freq: Once | INTRAMUSCULAR | Status: AC
  Administered 2014-11-17: 4 mg via INTRAVENOUS

## 2014-11-17 MED ORDER — BUPIVACAINE-EPINEPHRINE (PF) 0.5% -1:200000 IJ SOLN
INTRAMUSCULAR | Status: AC
  Filled 2014-11-17: qty 30

## 2014-11-17 MED ORDER — SCOPOLAMINE 1 MG/3DAYS TD PT72
MEDICATED_PATCH | TRANSDERMAL | Status: AC
Start: 2014-11-17 — End: 2014-11-17
  Filled 2014-11-17: qty 1

## 2014-11-17 MED ORDER — EPHEDRINE SULFATE 50 MG/ML IJ SOLN
INTRAMUSCULAR | Status: AC
  Filled 2014-11-17: qty 1

## 2014-11-17 MED ORDER — LACTATED RINGERS IV SOLN
INTRAVENOUS | Status: DC
  Administered 2014-11-17: 1000 mL via INTRAVENOUS

## 2014-11-17 MED ORDER — ONDANSETRON HCL 4 MG/2ML IJ SOLN
4.0000 mg | Freq: Once | INTRAMUSCULAR | Status: AC | PRN
Start: 2014-11-17 — End: 2014-11-17
  Administered 2014-11-17: 4 mg via INTRAVENOUS

## 2014-11-17 MED ORDER — SODIUM CHLORIDE 0.9 % IJ SOLN
INTRAMUSCULAR | Status: AC
  Filled 2014-11-17: qty 30

## 2014-11-17 MED ORDER — PROPOFOL 10 MG/ML IV BOLUS
INTRAVENOUS | Status: DC | PRN
  Administered 2014-11-17: 140 mg via INTRAVENOUS

## 2014-11-17 MED ORDER — CEFAZOLIN SODIUM-DEXTROSE 2-3 GM-% IV SOLR
2.0000 g | INTRAVENOUS | Status: AC
  Administered 2014-11-17: 2 g via INTRAVENOUS
  Filled 2014-11-17: qty 50

## 2014-11-17 MED ORDER — LIDOCAINE HCL (PF) 1 % IJ SOLN
INTRAMUSCULAR | Status: AC
  Filled 2014-11-17: qty 5

## 2014-11-17 MED ORDER — FENTANYL CITRATE (PF) 100 MCG/2ML IJ SOLN
25.0000 ug | INTRAMUSCULAR | Status: DC | PRN
  Administered 2014-11-17 (×2): 25 ug via INTRAVENOUS

## 2014-11-17 MED ORDER — SODIUM CHLORIDE 0.9 % IJ SOLN
INTRAMUSCULAR | Status: AC
  Filled 2014-11-17: qty 10

## 2014-11-17 MED ORDER — GLYCOPYRROLATE 0.2 MG/ML IJ SOLN
0.2000 mg | Freq: Once | INTRAMUSCULAR | Status: AC
  Administered 2014-11-17: 0.2 mg via INTRAVENOUS

## 2014-11-17 MED ORDER — PROPOFOL 10 MG/ML IV BOLUS
INTRAVENOUS | Status: AC
  Filled 2014-11-17: qty 20

## 2014-11-17 MED ORDER — LIDOCAINE HCL (CARDIAC) 20 MG/ML IV SOLN
INTRAVENOUS | Status: DC | PRN
  Administered 2014-11-17: 50 mg via INTRAVENOUS

## 2014-11-17 MED ORDER — BUPIVACAINE-EPINEPHRINE 0.5% -1:200000 IJ SOLN
INTRAMUSCULAR | Status: DC | PRN
  Administered 2014-11-17: 20 mL

## 2014-11-17 MED ORDER — SCOPOLAMINE 1 MG/3DAYS TD PT72
1.0000 | MEDICATED_PATCH | Freq: Once | TRANSDERMAL | Status: DC
  Administered 2014-11-17: 1.5 mg via TRANSDERMAL

## 2014-11-17 MED ORDER — MIDAZOLAM HCL 2 MG/2ML IJ SOLN
1.0000 mg | INTRAMUSCULAR | Status: DC | PRN
Start: 2014-11-17 — End: 2014-11-17
  Administered 2014-11-17: 2 mg via INTRAVENOUS

## 2014-11-17 MED ORDER — DEXAMETHASONE SODIUM PHOSPHATE 4 MG/ML IJ SOLN
INTRAMUSCULAR | Status: AC
  Filled 2014-11-17: qty 1

## 2014-11-17 SURGICAL SUPPLY — 29 items
ABLATOR ENDOMETRIAL BIPOLAR (ABLATOR) ×2 IMPLANT
BAG DECANTER FOR FLEXI CONT (MISCELLANEOUS) IMPLANT
BAG HAMPER (MISCELLANEOUS) ×2 IMPLANT
CATH ROBINSON RED A/P 16FR (CATHETERS) IMPLANT
CLOTH BEACON ORANGE TIMEOUT ST (SAFETY) ×2 IMPLANT
COVER LIGHT HANDLE STERIS (MISCELLANEOUS) ×4 IMPLANT
FORMALIN 10 PREFIL 120ML (MISCELLANEOUS) ×2 IMPLANT
GLOVE BIOGEL PI IND STRL 7.0 (GLOVE) ×1 IMPLANT
GLOVE BIOGEL PI IND STRL 7.5 (GLOVE) ×1 IMPLANT
GLOVE BIOGEL PI IND STRL 9 (GLOVE) ×1 IMPLANT
GLOVE BIOGEL PI INDICATOR 7.0 (GLOVE) ×1
GLOVE BIOGEL PI INDICATOR 7.5 (GLOVE) ×1
GLOVE BIOGEL PI INDICATOR 9 (GLOVE) ×1
GLOVE ECLIPSE 6.5 STRL STRAW (GLOVE) ×2 IMPLANT
GLOVE ECLIPSE 9.0 STRL (GLOVE) ×2 IMPLANT
GOWN SPEC L3 XXLG W/TWL (GOWN DISPOSABLE) ×2 IMPLANT
GOWN STRL REUS W/TWL LRG LVL3 (GOWN DISPOSABLE) ×2 IMPLANT
INST SET HYSTEROSCOPY (KITS) ×2 IMPLANT
IV NS IRRIG 3000ML ARTHROMATIC (IV SOLUTION) ×2 IMPLANT
KIT ROOM TURNOVER AP CYSTO (KITS) ×2 IMPLANT
KIT ROOM TURNOVER APOR (KITS) ×2 IMPLANT
MANIFOLD NEPTUNE II (INSTRUMENTS) ×2 IMPLANT
NS IRRIG 1000ML POUR BTL (IV SOLUTION) ×2 IMPLANT
PACK PERI GYN (CUSTOM PROCEDURE TRAY) ×2 IMPLANT
PAD ARMBOARD 7.5X6 YLW CONV (MISCELLANEOUS) ×2 IMPLANT
PAD TELFA 3X4 1S STER (GAUZE/BANDAGES/DRESSINGS) ×2 IMPLANT
SET BASIN LINEN APH (SET/KITS/TRAYS/PACK) ×2 IMPLANT
SET IRRIG Y TYPE TUR BLADDER L (SET/KITS/TRAYS/PACK) ×2 IMPLANT
SYR CONTROL 10ML LL (SYRINGE) ×2 IMPLANT

## 2014-11-17 NOTE — Anesthesia Procedure Notes (Signed)
Procedure Name: LMA Insertion Date/Time: 11/17/2014 7:42 AM Performed by: Andree Elk, Jahlisa Rossitto A Pre-anesthesia Checklist: Patient identified, Timeout performed, Emergency Drugs available, Suction available and Patient being monitored Patient Re-evaluated:Patient Re-evaluated prior to inductionOxygen Delivery Method: Circle system utilized Preoxygenation: Pre-oxygenation with 100% oxygen Intubation Type: IV induction Ventilation: Mask ventilation without difficulty LMA: LMA inserted LMA Size: 4.0 Number of attempts: 1 Placement Confirmation: positive ETCO2 and breath sounds checked- equal and bilateral Tube secured with: Tape Dental Injury: Teeth and Oropharynx as per pre-operative assessment

## 2014-11-17 NOTE — Brief Op Note (Signed)
11/17/2014  8:54 AM  PATIENT:  Lorraine Kelly  49 y.o. female  PRE-OPERATIVE DIAGNOSIS:  menorrhagia, dysmenorrhea  POST-OPERATIVE DIAGNOSIS:  menorrhagia, dysmenorrhea  PROCEDURE:  Procedure(s) with comments: HYSTEROSCOPY, UTERINE CURETTAGE, ENDOMETRIAL ABLATION USING NOVASURE (N/A) - Uterine Cavity Length 5.0cm Uterine Cavity Width 4.3cm Power 118 Time 1 minute 18 seconds  SURGEON:  Surgeon(s) and Role:    * Jonnie Kind, MD - Primary  PHYSICIAN ASSISTANT:   ASSISTANTS: none   ANESTHESIA:   local and general  EBL:  Total I/O In: 700 [I.V.:700] Out: 5 [Blood:5]  BLOOD ADMINISTERED:none  DRAINS: none   LOCAL MEDICATIONS USED:  MARCAINE    and Amount: 20 ml  SPECIMEN:  No Specimen  DISPOSITION OF SPECIMEN:  N/A  COUNTS:  YES  TOURNIQUET:  * No tourniquets in log *  DICTATION: .Dragon Dictation  PLAN OF CARE: Discharge to home after PACU  PATIENT DISPOSITION:  PACU - hemodynamically stable.   Delay start of Pharmacological VTE agent (>24hrs) due to surgical blood loss or risk of bleeding: not applicable

## 2014-11-17 NOTE — H&P (Signed)
  [facility] Visit  Patient name: Lorraine Kelly [id number] Date of birth: 1966/05/11  CC & HPI:  Lorraine Kelly is a 49 y.o. female presenting today for discuss ablation, also pt c/o moodiness x 3-4 days before menses noted x 85yrs pt "doesn't have her own brain' x a few days arund menses  ROS:  No u/s eval to date,  Pap normal 09/2014   Pertinent History Reviewed:  Reviewed: Significant for  Medical  Past Medical History  Diagnosis Date  . Depression   . GERD (gastroesophageal reflux disease)   . Allergy   . Plantar fasciitis     Surgical Hx:  Past Surgical History  Procedure Laterality Date  . Tubal ligation  12/1988  FAM HX; + FOR DAUGHTER WITH PCOS Patient reports significant postop nausea and vomiting with recovery after anesthesia, will discuss with anesthesiology preop.  Medications: Reviewed & Updated - see associated section   Current outpatient prescriptions:  . cetirizine (ZYRTEC) 10 MG tablet, Take 10 mg by mouth daily., Disp: , Rfl:  . Fish Oil-Cholecalciferol (FISH OIL + D3) 1000-1000 MG-UNIT CAPS, Take by mouth., Disp: , Rfl:  . omeprazole (PRILOSEC OTC) 20 MG tablet, Take 20 mg by mouth daily., Disp: , Rfl:  . CHANTIX CONTINUING MONTH PAK 1 MG tablet, , Disp: , Rfl:    Social History: Reviewed -  reports that she has been smoking Cigarettes. She has a 13.5 pack-year smoking history. She has never used smokeless tobacco.  Objective Findings:  Vitals: Blood pressure 136/78, height 5\' 7"  (1.702 m), weight 194 lb (87.998 kg), last menstrual period 09/20/2014.  Physical Examination: General appearance - alert, well appearing, and in no distress, oriented to person, place, and time and overweight Physical Examination: Eyes - pupils equal and reactive, extraocular eye movements intact Neck - supple, no significant adenopathy Chest -  clear to auscultation, no wheezes, rales or rhonchi, symmetric air entry Heart - normal rate and regular rhythm Abdomen - soft, nontender, nondistended, no masses or organomegaly Pelvic - VULVA: normal appearing vulva with no masses, tenderness or lesions, VAGINA: normal appearing vagina with normal color and discharge, no lesions, CERVIX: normal appearing cervix without discharge or lesions,  UTERUS: uterus is normal size, shape, consistency and nontender, anteverted Extremities - peripheral pulses normal, no pedal edema, no clubbing or cyanosis Mental status - alert, oriented to person, place, and time, normal mood, behavior, speech, dress, motor activity, and thought processes   Assessment & Plan:   A:  1. PMMD 2, Painful menses, with associated mood changes 3. S/p BTL   4. Nausea and vomiting associated with anesthesia.     Plan: Hysteroscopy, Dilation and Curettage with endometrial ablation using Novasure.

## 2014-11-17 NOTE — Op Note (Signed)
11/17/2014  8:54 AM  PATIENT:  Lorraine Kelly  49 y.o. female  PRE-OPERATIVE DIAGNOSIS:  menorrhagia, dysmenorrhea  POST-OPERATIVE DIAGNOSIS:  menorrhagia, dysmenorrhea  PROCEDURE:  Procedure(s) with comments: HYSTEROSCOPY, UTERINE CURETTAGE, ENDOMETRIAL ABLATION USING NOVASURE (N/A) - Uterine Cavity Length 5.0cm Uterine Cavity Width 4.3cm Power 118 Time 1 minute 18 seconds  SURGEON:  Surgeon(s) and Role:    * Jonnie Kind, MD - Primary  PHYSICIAN ASSISTANT:   ASSISTANTS: none   ANESTHESIA:   local and general  EBL:  Total I/O In: 700 [I.V.:700] Out: 5 [Blood:5]  BLOOD ADMINISTERED:none  DRAINS: none   LOCAL MEDICATIONS USED:  MARCAINE    and Amount: 20 ml  SPECIMEN:  No Specimen  DISPOSITION OF SPECIMEN:  N/A  COUNTS:  YES  TOURNIQUET:  * No tourniquets in log *  DICTATION: .Dragon Dictation  PLAN OF CARE: Discharge to home after PACU  PATIENT DISPOSITION:  PACU - hemodynamically stable.   Delay start of Pharmacological VTE agent (>24hrs) due to surgical blood loss or risk of bleeding: not applicable   Her: Patient was taken operating room prepped and draped for vaginal procedure. Timeout was conducted, and procedure confirmed by operative team. Ancef administered. Paracervical block was applied, 20 cc Marcaine. Uterus was grasped with single-tooth tenaculum, sounded to 9 cm, 5 cm which was the endometrial cavity, dilated to 25 [place] allowing hysteroscopy which showed thin atrophic endometrium no polyps or tissue pathology suspected. The NovaSure endometrial ablation device was prepared inserted set up as mentioned above and the 1 minute 18 seconds ablation sequence completed without difficulty as per protocol. Virgina Jock was available and in the room for monitoring of the case. The patient was allowed to awaken and go to recovery room in good condition. She'll be discharged home with Toradol and tramadol for pain management. Sponge and needle counts correct

## 2014-11-17 NOTE — Transfer of Care (Signed)
Immediate Anesthesia Transfer of Care Note  Patient: Lorraine Kelly  Procedure(s) Performed: Procedure(s) with comments: HYSTEROSCOPY, UTERINE CURETTAGE, ENDOMETRIAL ABLATION USING NOVASURE (N/A) - Uterine Cavity Length Uterine Cavity Width Power Time  Patient Location: PACU  Anesthesia Type:General  Level of Consciousness: awake, alert , oriented and patient cooperative  Airway & Oxygen Therapy: Patient connected to face mask oxygen  Post-op Assessment: Report given to RN and Post -op Vital signs reviewed and stable  Post vital signs: Reviewed and stable  Last Vitals:  Filed Vitals:   11/17/14 0715  BP: 101/62  Pulse:   Temp:   Resp: 17    Complications: No apparent anesthesia complications

## 2014-11-17 NOTE — Anesthesia Postprocedure Evaluation (Signed)
  Anesthesia Post-op Note Late Entry Patient: Lorraine Kelly  Procedure(s) Performed: Procedure(s) with comments: HYSTEROSCOPY, UTERINE CURETTAGE, ENDOMETRIAL ABLATION USING NOVASURE (N/A) - Uterine Cavity Length 5.0cm Uterine Cavity Width 4.3cm Power 118 Time 1 minute 18 seconds  Patient Location: PACU  Anesthesia Type:General  Level of Consciousness: awake, alert , oriented and patient cooperative  Airway and Oxygen Therapy: Patient Spontanous Breathing  Post-op Pain: mild  Post-op Assessment: Post-op Vital signs reviewed, Patient's Cardiovascular Status Stable, Respiratory Function Stable, Patent Airway and No signs of Nausea or vomiting              Post-op Vital Signs: Reviewed and stable  Last Vitals:  Filed Vitals:   11/17/14 0900  BP: 108/59  Pulse: 90  Temp:   Resp: 15    Complications: No apparent anesthesia complications

## 2014-11-17 NOTE — Discharge Instructions (Signed)
Hysteroscopy, Care After °Refer to this sheet in the next few weeks. These instructions provide you with information on caring for yourself after your procedure. Your health care provider may also give you more specific instructions. Your treatment has been planned according to current medical practices, but problems sometimes occur. Call your health care provider if you have any problems or questions after your procedure.  °WHAT TO EXPECT AFTER THE PROCEDURE °After your procedure, it is typical to have the following: °· You may have some cramping. This normally lasts for a couple days. °· You may have bleeding. This can vary from light spotting for a few days to menstrual-like bleeding for 3-7 days. °HOME CARE INSTRUCTIONS °· Rest for the first 1-2 days after the procedure. °· Only take over-the-counter or prescription medicines as directed by your health care provider. Do not take aspirin. It can increase the chances of bleeding. °· Take showers instead of baths for 2 weeks or as directed by your health care provider. °· Do not drive for 24 hours or as directed. °· Do not drink alcohol while taking pain medicine. °· Do not use tampons, douche, or have sexual intercourse for 2 weeks or until your health care provider says it is okay. °· Take your temperature twice a day for 4-5 days. Write it down each time. °· Follow your health care provider's advice about diet, exercise, and lifting. °· If you develop constipation, you may: °· Take a mild laxative if your health care provider approves. °· Add bran foods to your diet. °· Drink enough fluids to keep your urine clear or pale yellow. °· Try to have someone with you or available to you for the first 24-48 hours, especially if you were given a general anesthetic. °· Follow up with your health care provider as directed. °SEEK MEDICAL CARE IF: °· You feel dizzy or lightheaded. °· You feel sick to your stomach (nauseous). °· You have abnormal vaginal discharge. °· You  have a rash. °· You have pain that is not controlled with medicine. °SEEK IMMEDIATE MEDICAL CARE IF: °· You have bleeding that is heavier than a normal menstrual period. °· You have a fever. °· You have increasing cramps or pain, not controlled with medicine. °· You have new belly (abdominal) pain. °· You pass out. °· You have pain in the tops of your shoulders (shoulder strap areas). °· You have shortness of breath. °Document Released: 03/17/2013 Document Reviewed: 03/17/2013 °[employer]® Patient Information ©2015 [employer]. This information is not intended to replace advice given to you by your health care provider. Make sure you discuss any questions you have with your health care provider. ° °Dilation and Curettage or Vacuum Curettage, Care After °Refer to this sheet in the next few weeks. These instructions provide you with information on caring for yourself after your procedure. Your health care provider may also give you more specific instructions. Your treatment has been planned according to current medical practices, but problems sometimes occur. Call your health care provider if you have any problems or questions after your procedure. °WHAT TO EXPECT AFTER THE PROCEDURE °After your procedure, it is typical to have light cramping and bleeding. This may last for 2 days to 2 weeks after the procedure. °HOME CARE INSTRUCTIONS  °· Do not drive for 24 hours. °· Wait 1 week before returning to strenuous activities. °· Take your temperature 2 times a day for 4 days and write it down. Provide these temperatures to your health care provider if   you develop a fever. °· Avoid long periods of standing. °· Avoid heavy lifting, pushing, or pulling. Do not lift anything heavier than 10 pounds (4.5 kg). °· Limit stair climbing to once or twice a day. °· Take rest periods often. °· You may resume your usual diet. °· Drink enough fluids to keep your urine clear or pale yellow. °· Your usual bowel function should return. If  you have constipation, you may: °¨ Take a mild laxative with permission from your health care provider. °¨ Add fruit and bran to your diet. °¨ Drink more fluids. °· Take showers instead of baths until your health care provider gives you permission to take baths. °· Do not go swimming or use a hot tub until your health care provider approves. °· Try to have someone with you or available to you the first 24-48 hours, especially if you were given a general anesthetic. °· Do not douche, use tampons, or have sex (intercourse) for 2 weeks after the procedure. °· Only take over-the-counter or prescription medicines as directed by your health care provider. Do not take aspirin. It can cause bleeding. °· Follow up with your health care provider as directed. °SEEK MEDICAL CARE IF:  °· You have increasing cramps or pain that is not relieved with medicine. °· You have abdominal pain that does not seem to be related to the same area of earlier cramping and pain. °· You have bad smelling vaginal discharge. °· You have a rash. °· You are having problems with any medicine. °SEEK IMMEDIATE MEDICAL CARE IF:  °· You have bleeding that is heavier than a normal menstrual period. °· You have a fever. °· You have chest pain. °· You have shortness of breath. °· You feel dizzy or feel like fainting. °· You pass out. °· You have pain in your shoulder strap area. °· You have heavy vaginal bleeding with or without blood clots. °MAKE SURE YOU:  °· Understand these instructions. °· Will watch your condition. °· Will get help right away if you are not doing well or get worse. °Document Released: 05/24/2000 Document Revised: 06/01/2013 Document Reviewed: 12/24/2012 °Exit[employer]® Patient Information ©2015 [employer]. This information is not intended to replace advice given to you by your health care provider. Make sure you discuss any questions you have with your health care provider. ° ° °

## 2014-11-17 NOTE — Anesthesia Preprocedure Evaluation (Signed)
Anesthesia Evaluation  Patient identified by MRN, date of birth, ID band Patient awake    Reviewed: Allergy & Precautions, NPO status , Patient's Chart, lab work & pertinent test results  History of Anesthesia Complications (+) PONV and history of anesthetic complications  Airway Mallampati: II  TM Distance: >3 FB     Dental  (+) Teeth Intact   Pulmonary Current Smoker,  breath sounds clear to auscultation        Cardiovascular negative cardio ROS  Rhythm:Regular Rate:Normal     Neuro/Psych PSYCHIATRIC DISORDERS Anxiety Depression    GI/Hepatic GERD-  Medicated and Controlled,  Endo/Other    Renal/GU      Musculoskeletal   Abdominal   Peds  Hematology   Anesthesia Other Findings   Reproductive/Obstetrics                             Anesthesia Physical Anesthesia Plan  ASA: II  Anesthesia Plan: General   Post-op Pain Management:    Induction: Intravenous  Airway Management Planned: LMA  Additional Equipment:   Intra-op Plan:   Post-operative Plan: Extubation in OR  Informed Consent: I have reviewed the patients History and Physical, chart, labs and discussed the procedure including the risks, benefits and alternatives for the proposed anesthesia with the patient or authorized representative who has indicated his/her understanding and acceptance.     Plan Discussed with:   Anesthesia Plan Comments:         Anesthesia Quick Evaluation

## 2014-11-18 ENCOUNTER — Encounter (HOSPITAL_COMMUNITY): Payer: Self-pay | Admitting: Obstetrics and Gynecology

## 2014-11-21 ENCOUNTER — Telehealth: Payer: Self-pay | Admitting: Obstetrics & Gynecology

## 2014-11-21 NOTE — Telephone Encounter (Signed)
Pt states had Endometrial Ablation on 11/17/2014 returning to work tonight, requesting a note for light duty. 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 Call transferred to Dr. Glo Herring to discuss pt concerns.

## 2014-11-23 ENCOUNTER — Encounter: Payer: 59 | Admitting: Obstetrics and Gynecology

## 2014-11-25 ENCOUNTER — Encounter: Payer: 59 | Admitting: Obstetrics and Gynecology

## 2014-11-25 ENCOUNTER — Encounter: Payer: Self-pay | Admitting: *Deleted

## 2014-11-25 MED ORDER — IBUPROFEN 800 MG PO TABS: ORAL_TABLET | ORAL | Status: DC

## 2014-11-25 NOTE — Progress Notes (Signed)
Patient ID: Lorraine Kelly, female   DOB: 12-29-1965, 49 y.o.   MRN: [id number] Pt walked into office today with concerns about vaginal discharge with a vinegar odor, cramping, and breast tenderness, no fever, since her procedure (Endometrial Ablation) on 11/17/2014. Pt also concerned about lifting and pulling 50 to 75 lbs to preform her job due to increasing the severity of the cramps and discomfort. Pt states she has taken all the Toradol. Pt informed Dr. Glo Herring out of office, discussed pt concerns with Derrek Monaco, NP. Pt informed per Derrek Monaco, NP will prescribed Motrin 800 mg q 8 hr for the cramps to [facility], keep hydrated, note given for restricted lifting nothing > 25 lbs until pt post op appt next week.

## 2014-11-28 NOTE — Telephone Encounter (Signed)
Pt given note for light duty for first week

## 2014-11-29 ENCOUNTER — Encounter: Payer: Self-pay | Admitting: Obstetrics and Gynecology

## 2014-11-29 ENCOUNTER — Ambulatory Visit (INDEPENDENT_AMBULATORY_CARE_PROVIDER_SITE_OTHER): Payer: 59 | Admitting: Obstetrics and Gynecology

## 2014-11-29 VITALS — BP 122/80 | Ht 67.0 in | Wt 196.0 lb

## 2014-11-29 DIAGNOSIS — Z9889 Other specified postprocedural states: Secondary | ICD-10-CM | POA: Diagnosis not present

## 2014-11-29 NOTE — Progress Notes (Signed)
Patient ID: LILLYN WIECZOREK, female   DOB: 1966-06-08, 49 y.o.   MRN: [id number] Pt here today for post op visit. Pt denies any problems or concerns at this time. Pt states that she is still having a orange/yelllow tinged discharge.    Subjective:  JASHAWNA REEVER is a 49 y.o. female now 2 weeks status post operative hysteroscopy and endometrial ablation.    pt only recently in last 3 days can she work Review of Systems Negative except slow return to normal bm, has had lots of mod constipation r diet:   reg   Bowel movements : slow to return to acceptable. using juices, and occasional activia..  The patient is not having any pain.  Objective:  BP 122/80 mmHg  Ht 5\' 7"  (1.702 m)  Wt 196 lb (88.905 kg)  BMI 30.69 kg/m2  LMP 11/14/2014 General:Well developed, well nourished.  No acute distress. Abdomen: Bowel sounds normal, soft, non-tender. Pelvic Exam:    External Genitalia:  Normal.    Vagina: Normal    Cervix: Normal    Uterus: Normal moderate descensus 2nd degree.    Adnexa/Bimanual: Normal  Incision(s):   Healing lite d/c only, no drainage, no erythema, no hernia, no swelling,     Assessment:  Post-Op 2 weeks s/p operative hysteroscopy and endo ablation   ** Doing well postoperatively.   Plan:   2. . current medications.stool softener/juices,, pran, chickory 3. Activity restrictions: none and full duty 4. return to work: not applicable. 5. Follow up in prn .

## 2014-12-21 ENCOUNTER — Other Ambulatory Visit: Payer: Self-pay | Admitting: Orthopedic Surgery

## 2014-12-21 DIAGNOSIS — M542 Cervicalgia: Secondary | ICD-10-CM

## 2015-01-24 ENCOUNTER — Encounter: Payer: Self-pay | Admitting: Obstetrics & Gynecology

## 2015-01-24 ENCOUNTER — Ambulatory Visit (INDEPENDENT_AMBULATORY_CARE_PROVIDER_SITE_OTHER): Payer: 59 | Admitting: Obstetrics & Gynecology

## 2015-01-24 VITALS — BP 100/70 | HR 92 | Wt 209.0 lb

## 2015-01-24 DIAGNOSIS — N814 Uterovaginal prolapse, unspecified: Secondary | ICD-10-CM

## 2015-01-24 DIAGNOSIS — N8110 Cystocele, unspecified: Secondary | ICD-10-CM

## 2015-01-24 DIAGNOSIS — IMO0002 Reserved for concepts with insufficient information to code with codable children: Secondary | ICD-10-CM

## 2015-01-24 DIAGNOSIS — N3281 Overactive bladder: Secondary | ICD-10-CM

## 2015-01-24 DIAGNOSIS — N939 Abnormal uterine and vaginal bleeding, unspecified: Secondary | ICD-10-CM | POA: Diagnosis not present

## 2015-01-24 MED ORDER — MEGESTROL ACETATE 40 MG PO TABS: ORAL_TABLET | ORAL | Status: DC

## 2015-01-24 MED ORDER — SOLIFENACIN SUCCINATE 10 MG PO TABS: 10.0000 mg | ORAL_TABLET | Freq: Every day | ORAL | Status: DC

## 2015-01-24 NOTE — Progress Notes (Signed)
Patient ID: Lorraine Kelly, female   DOB: 09-15-1965, 48 y.o.   MRN: [id number] Chief Complaint  Patient presents with  . gyn visit    c/c heavy pressure.think something buldging out.    Blood pressure 100/70, pulse 92, weight 209 lb (94.802 kg).  Subjective Bleeding just about every day since novasure ablation in June sometime sheavy mostly just steady  Also with symptoms of pelvic pressure ?prolpapse for the past 6 weeks  Objective Vulva:  normal appearing vulva with no masses, tenderness or lesions Vagina:  Grade 3 cystocoele, Grade 3 uterine prolapse rectum is well supported Cervix:  no cervical motion tenderness and no lesions Uterus:  normal size, contour, position, consistency, mobility, non-tender Adnexa: ovaries:present,  normal adnexa in size, nontender and no masses    Pertinent ROS No burning with urination, frequency or urgency No nausea, vomiting or diarrhea Nor fever chills or other constitutional symptoms   Labs or studies   Impression New Diagnosis: 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

## 2015-02-24 ENCOUNTER — Ambulatory Visit (INDEPENDENT_AMBULATORY_CARE_PROVIDER_SITE_OTHER): Payer: 59 | Admitting: Obstetrics & Gynecology

## 2015-02-24 ENCOUNTER — Encounter: Payer: Self-pay | Admitting: Obstetrics & Gynecology

## 2015-02-24 VITALS — BP 120/82 | Ht 67.0 in | Wt 208.0 lb

## 2015-02-24 DIAGNOSIS — N8110 Cystocele, unspecified: Secondary | ICD-10-CM | POA: Diagnosis not present

## 2015-02-24 DIAGNOSIS — N3281 Overactive bladder: Secondary | ICD-10-CM | POA: Diagnosis not present

## 2015-02-24 DIAGNOSIS — IMO0002 Reserved for concepts with insufficient information to code with codable children: Secondary | ICD-10-CM

## 2015-02-24 MED ORDER — OXYBUTYNIN CHLORIDE ER 10 MG PO TB24: 10.0000 mg | ORAL_TABLET | Freq: Every day | ORAL | Status: DC

## 2015-02-24 NOTE — Progress Notes (Signed)
Patient ID: Lorraine Kelly, female   DOB: 04-28-1966, 49 y.o.   MRN: [id number]   Patient ID: Lorraine Kelly, female   DOB: 1966/05/13, 49 y.o.   MRN: [id number] Chief Complaint  Patient presents with  . Follow-up    Blood pressure 120/82, height 5\' 7"  (1.702 m), weight 208 lb (94.348 kg).  Subjective Bleeding just about every day since novasure ablation in June sometime sheavy mostly just steady  Also with symptoms of pelvic pressure ?prolpapse for the past 6 weeks  Objective Vulva:  normal appearing vulva with no masses, tenderness or lesions Vagina:  Grade 3 cystocoele, Grade 3 uterine prolapse rectum is well supported Cervix:  no cervical motion tenderness and no lesions Uterus:  normal size, contour, position, consistency, mobility, non-tender Adnexa: ovaries:present,  normal adnexa in size, nontender and no masses    Pertinent ROS No burning with urination, frequency or urgency No nausea, vomiting or diarrhea Nor fever chills or other constitutional symptoms   Labs or studies   Impression New Diagnosis: 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   Chief Complaint  Patient presents with  . Follow-up    Blood pressure 120/82, height 5\' 7"  (1.702 m), weight 208 lb (94.348 kg).  Doing well on vesicare  Wants to continue Follow up prn      Face to face time:  10 minutes  Greater than 50% of the visit time was spent in counseling and coordination of care with the patient.  The summary and outline of the counseling and care coordination is summarized in the note above.   All questions were answered.

## 2016-03-20 ENCOUNTER — Ambulatory Visit (HOSPITAL_COMMUNITY)
Admission: RE | Admit: 2016-03-20 | Discharge: 2016-03-20 | Disposition: A | Discharge status: Home / Self Care | Payer: 59 | Source: Ambulatory Visit | Attending: Family Medicine | Admitting: Family Medicine

## 2016-03-20 ENCOUNTER — Other Ambulatory Visit (HOSPITAL_COMMUNITY): Payer: Self-pay | Admitting: Family Medicine

## 2016-03-20 DIAGNOSIS — R0989 Other specified symptoms and signs involving the circulatory and respiratory systems: Secondary | ICD-10-CM | POA: Insufficient documentation

## 2016-03-20 DIAGNOSIS — B59 Pneumocystosis: Secondary | ICD-10-CM

## 2016-04-04 ENCOUNTER — Other Ambulatory Visit (HOSPITAL_COMMUNITY): Payer: Self-pay | Admitting: Otolaryngology

## 2016-04-04 DIAGNOSIS — J329 Chronic sinusitis, unspecified: Secondary | ICD-10-CM

## 2016-07-06 ENCOUNTER — Emergency Department (HOSPITAL_COMMUNITY)
Admission: EM | Admit: 2016-07-06 | Discharge: 2016-07-06 | Disposition: A | Discharge status: Home / Self Care | Payer: 59 | Attending: Emergency Medicine | Admitting: Emergency Medicine

## 2016-07-06 ENCOUNTER — Encounter (HOSPITAL_COMMUNITY): Payer: Self-pay | Admitting: Emergency Medicine

## 2016-07-06 DIAGNOSIS — R69 Illness, unspecified: Secondary | ICD-10-CM

## 2016-07-06 DIAGNOSIS — Z79899 Other long term (current) drug therapy: Secondary | ICD-10-CM | POA: Diagnosis not present

## 2016-07-06 DIAGNOSIS — Z87891 Personal history of nicotine dependence: Secondary | ICD-10-CM | POA: Diagnosis not present

## 2016-07-06 DIAGNOSIS — R5383 Other fatigue: Secondary | ICD-10-CM | POA: Insufficient documentation

## 2016-07-06 DIAGNOSIS — R509 Fever, unspecified: Secondary | ICD-10-CM | POA: Diagnosis not present

## 2016-07-06 DIAGNOSIS — R05 Cough: Secondary | ICD-10-CM | POA: Insufficient documentation

## 2016-07-06 DIAGNOSIS — J111 Influenza due to unidentified influenza virus with other respiratory manifestations: Secondary | ICD-10-CM

## 2016-07-06 MED ORDER — OSELTAMIVIR PHOSPHATE 75 MG PO CAPS: 75.0000 mg | ORAL_CAPSULE | Freq: Two times a day (BID) | ORAL | 0 refills | Status: DC

## 2016-07-06 NOTE — ED Triage Notes (Signed)
Sent home from work with URI sx and flu like sx for several days

## 2016-07-06 NOTE — ED Notes (Signed)
Patient with no complaints at this time. Respirations even and unlabored. Skin warm/dry. Discharge instructions reviewed with patient at this time. Patient given opportunity to voice concerns/ask questions. Patient discharged at this time and left Emergency Department with steady gait.   

## 2016-07-06 NOTE — ED Provider Notes (Signed)
[facility] DEPT Provider Note   CSN: [id number] Arrival date & time: 07/06/16  1654     History   Chief Complaint Chief Complaint  Patient presents with  . Influenza    x several days    HPI Lorraine Kelly is a 51 y.o. female.  The history is provided by the patient. No language interpreter was used.  Influenza  Presenting symptoms: cough, fatigue and fever   Severity:  Moderate Onset quality:  Gradual Duration:  2 days Progression:  Worsening Chronicity:  New Relieved by:  Nothing Worsened by:  Nothing Ineffective treatments:  None tried Associated symptoms: chills   Risk factors: sick contacts     Past Medical History:  Diagnosis Date  . Allergy   . Complication of anesthesia    waking up too soon; severe itching and rash  . Depression   . GERD (gastroesophageal reflux disease)   . Plantar fasciitis   . PONV (postoperative nausea and vomiting)     Patient Active Problem List   Diagnosis Date Noted  . Heavy menses 11/17/2014  . Premenstrual symptom 11/17/2014  . 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 Smoker 07/29/2013  . GERD (gastroesophageal reflux disease) 07/29/2013  . Seasonal allergies 07/29/2013    Past Surgical History:  Procedure Laterality Date  . DILITATION & CURRETTAGE/HYSTROSCOPY WITH NOVASURE ABLATION N/A 11/17/2014   Procedure: HYSTEROSCOPY, UTERINE CURETTAGE, ENDOMETRIAL ABLATION USING NOVASURE;  Surgeon: Jonnie Kind, MD;  Location: [facility];  Service: Gynecology;  Laterality: N/A;  Uterine Cavity Length 5.0cm Uterine Cavity Width 4.3cm Power 118 Time 1 minute 18 seconds  . TUBAL LIGATION  12/1988  . WISDOM TOOTH EXTRACTION Bilateral 1996    OB History    No data available       Home Medications    Prior to Admission medications   Medication Sig Start Date End Date Taking? Authorizing Provider  cetirizine (ZYRTEC) 10 MG tablet Take 10 mg by mouth daily.   Yes Historical Provider, MD  dextromethorphan-guaiFENesin  (MUCINEX DM) 30-600 MG 12hr tablet Take 1 tablet by mouth 2 (two) times daily.   Yes Historical Provider, MD  diphenhydrAMINE (BENADRYL) 50 MG tablet Take 50 mg by mouth at bedtime as needed for itching.   Yes Historical Provider, MD  ibuprofen (ADVIL,MOTRIN) 800 MG tablet Take 800 mg by mouth every 8 (eight) hours as needed for fever.   Yes Historical Provider, MD  omeprazole (PRILOSEC OTC) 20 MG tablet Take 20 mg by mouth daily.   Yes Historical Provider, MD  oseltamivir (TAMIFLU) 75 MG capsule Take 1 capsule (75 mg total) by mouth every 12 (twelve) hours. 07/06/16   Fransico Meadow, PA-C  oxybutynin (DITROPAN XL) 10 MG 24 hr tablet Take 1 tablet (10 mg total) by mouth at bedtime. 02/24/15   Florian Buff, MD    Family History Family History  Problem Relation Age of Onset  . Hypertension Mother   . Hypertension Father   . ADD / ADHD Sister   . Depression Sister   . Diabetes Daughter   . Diabetes Maternal Grandmother   . Heart disease Maternal Grandmother   . Cancer Maternal Grandfather     throat, prostate  . Hypertension Maternal Grandfather   . Diabetes Paternal Grandmother   . Hypertension Paternal Grandmother   . Arthritis Paternal Grandmother   . ADD / ADHD Sister     Social History Social History  Substance Use Topics  . Smoking status: Former Smoker    Packs/day:  0.75    Years: 18.00    Types: Cigarettes  . Smokeless tobacco: Never Used  . Alcohol use No     Allergies   Patient has no active allergies.   Review of Systems Review of Systems  Constitutional: Positive for chills, fatigue and fever.  Respiratory: Positive for cough.   All other systems reviewed and are negative.    Physical Exam Updated Vital Signs BP 131/80 (BP Location: Right Arm)   Pulse 98   Temp 98 F (36.7 C)   Resp 20   Ht 5\' 7"  (1.702 m)   Wt 89.4 kg   LMP 06/14/2016   SpO2 99%   BMI 30.85 kg/m   Physical Exam  Constitutional: She appears well-developed and well-nourished.  No distress.  HENT:  Head: Normocephalic and atraumatic.  Eyes: Conjunctivae are normal.  Neck: Neck supple.  Cardiovascular: Normal rate and regular rhythm.   No murmur heard. Pulmonary/Chest: Effort normal and breath sounds normal. No respiratory distress.  Abdominal: Soft. There is no tenderness.  Musculoskeletal: She exhibits no edema.  Neurological: She is alert.  Skin: Skin is warm and dry.  Psychiatric: She has a normal mood and affect.  Nursing note and vitals reviewed.    ED Treatments / Results  Labs (all labs ordered are listed, but only abnormal results are displayed) Labs Reviewed - No data to display  EKG  EKG Interpretation None       Radiology No results found.  Procedures Procedures (including critical care time)  Medications Ordered in ED Medications - No data to display   Initial Impression / Assessment and Plan / ED Course  I have reviewed the triage vital signs and the nursing notes.  Pertinent labs & imaging results that were available during my care of the patient were reviewed by me and considered in my medical decision making (see chart for details).     An After Visit Summary was printed and given to the patient. Current Meds  Medication Sig  . cetirizine (ZYRTEC) 10 MG tablet Take 10 mg by mouth daily.  Marland Kitchen dextromethorphan-guaiFENesin (MUCINEX DM) 30-600 MG 12hr tablet Take 1 tablet by mouth 2 (two) times daily.  . diphenhydrAMINE (BENADRYL) 50 MG tablet Take 50 mg by mouth at bedtime as needed for itching.  Marland Kitchen ibuprofen (ADVIL,MOTRIN) 800 MG tablet Take 800 mg by mouth every 8 (eight) hours as needed for fever.  Marland Kitchen omeprazole (PRILOSEC OTC) 20 MG tablet Take 20 mg by mouth daily.     Final Clinical Impressions(s) / ED Diagnoses   Final diagnoses:  Influenza-like illness    New Prescriptions Discharge Medication List as of 07/06/2016  6:25 PM    START taking these medications   Details  oseltamivir (TAMIFLU) 75 MG capsule Take  1 capsule (75 mg total) by mouth every 12 (twelve) hours., Starting Sat 07/06/2016, [place], PA-C 07/06/16 [place], DO 07/10/16 1345

## 2016-07-25 DIAGNOSIS — N926 Irregular menstruation, unspecified: Secondary | ICD-10-CM | POA: Diagnosis not present

## 2016-07-25 DIAGNOSIS — E559 Vitamin D deficiency, unspecified: Secondary | ICD-10-CM | POA: Diagnosis not present

## 2016-07-25 DIAGNOSIS — N951 Menopausal and female climacteric states: Secondary | ICD-10-CM | POA: Diagnosis not present

## 2016-07-25 DIAGNOSIS — G4726 Circadian rhythm sleep disorder, shift work type: Secondary | ICD-10-CM | POA: Diagnosis not present

## 2016-08-19 DIAGNOSIS — N390 Urinary tract infection, site not specified: Secondary | ICD-10-CM | POA: Diagnosis not present

## 2016-10-14 DIAGNOSIS — B379 Candidiasis, unspecified: Secondary | ICD-10-CM | POA: Diagnosis not present

## 2016-11-16 ENCOUNTER — Emergency Department (HOSPITAL_COMMUNITY)
Admission: EM | Admit: 2016-11-16 | Discharge: 2016-11-17 | Disposition: A | Discharge status: Home / Self Care | Payer: 59 | Attending: Emergency Medicine | Admitting: Emergency Medicine

## 2016-11-16 ENCOUNTER — Encounter (HOSPITAL_COMMUNITY): Payer: Self-pay

## 2016-11-16 DIAGNOSIS — Z87891 Personal history of nicotine dependence: Secondary | ICD-10-CM | POA: Diagnosis not present

## 2016-11-16 DIAGNOSIS — N3001 Acute cystitis with hematuria: Secondary | ICD-10-CM | POA: Diagnosis not present

## 2016-11-16 DIAGNOSIS — Z79899 Other long term (current) drug therapy: Secondary | ICD-10-CM | POA: Insufficient documentation

## 2016-11-16 LAB — URINALYSIS, ROUTINE W REFLEX MICROSCOPIC
Bilirubin Urine: NEGATIVE
GLUCOSE, UA: NEGATIVE mg/dL
KETONES UR: NEGATIVE mg/dL
NITRITE: POSITIVE — AB
PH: 5 (ref 5.0–8.0)
Protein, ur: 100 mg/dL — AB
Specific Gravity, Urine: 1.017 (ref 1.005–1.030)

## 2016-11-16 LAB — PREGNANCY, URINE: Preg Test, Ur: NEGATIVE

## 2016-11-16 MED ORDER — ONDANSETRON HCL 4 MG/2ML IJ SOLN
4.0000 mg | Freq: Once | INTRAMUSCULAR | Status: AC
  Administered 2016-11-16: 4 mg via INTRAVENOUS
  Filled 2016-11-16: qty 2

## 2016-11-16 MED ORDER — KETOROLAC TROMETHAMINE 30 MG/ML IJ SOLN
30.0000 mg | Freq: Once | INTRAMUSCULAR | Status: AC
  Administered 2016-11-16: 30 mg via INTRAVENOUS
  Filled 2016-11-16: qty 1

## 2016-11-16 MED ORDER — PHENAZOPYRIDINE HCL 100 MG PO TABS
200.0000 mg | ORAL_TABLET | Freq: Once | ORAL | Status: AC
  Administered 2016-11-16: 200 mg via ORAL
  Filled 2016-11-16: qty 2

## 2016-11-16 MED ORDER — SODIUM CHLORIDE 0.9 % IV BOLUS (SEPSIS)
1000.0000 mL | Freq: Once | INTRAVENOUS | Status: AC
  Administered 2016-11-16: 1000 mL via INTRAVENOUS

## 2016-11-16 MED ORDER — DEXTROSE 5 % IV SOLN
1.0000 g | Freq: Once | INTRAVENOUS | Status: AC
  Administered 2016-11-17: 1 g via INTRAVENOUS
  Filled 2016-11-16: qty 10

## 2016-11-16 NOTE — ED Triage Notes (Signed)
Reports of lower abdominal pain with dysuria/hematuria since yesterday.

## 2016-11-17 LAB — CBC WITH DIFFERENTIAL/PLATELET
BASOS ABS: 0 10*3/uL (ref 0.0–0.1)
Basophils Relative: 0 %
Eosinophils Absolute: 0.3 10*3/uL (ref 0.0–0.7)
Eosinophils Relative: 3 %
HEMATOCRIT: 36.7 % (ref 36.0–46.0)
HEMOGLOBIN: 12.4 g/dL (ref 12.0–15.0)
Lymphocytes Relative: 20 %
Lymphs Abs: 2.4 10*3/uL (ref 0.7–4.0)
MCH: 30.7 pg (ref 26.0–34.0)
MCHC: 33.8 g/dL (ref 30.0–36.0)
MCV: 90.8 fL (ref 78.0–100.0)
MONO ABS: 1 10*3/uL (ref 0.1–1.0)
Monocytes Relative: 8 %
NEUTROS ABS: 8.5 10*3/uL — AB (ref 1.7–7.7)
NEUTROS PCT: 69 %
Platelets: 269 10*3/uL (ref 150–400)
RBC: 4.04 MIL/uL (ref 3.87–5.11)
RDW: 14 % (ref 11.5–15.5)
WBC: 12.1 10*3/uL — AB (ref 4.0–10.5)

## 2016-11-17 LAB — COMPREHENSIVE METABOLIC PANEL
ALK PHOS: 61 U/L (ref 38–126)
ALT: 15 U/L (ref 14–54)
AST: 19 U/L (ref 15–41)
Albumin: 3.4 g/dL — ABNORMAL LOW (ref 3.5–5.0)
Anion gap: 9 (ref 5–15)
BILIRUBIN TOTAL: 0.2 mg/dL — AB (ref 0.3–1.2)
BUN: 10 mg/dL (ref 6–20)
CO2: 22 mmol/L (ref 22–32)
CREATININE: 0.78 mg/dL (ref 0.44–1.00)
Calcium: 8.2 mg/dL — ABNORMAL LOW (ref 8.9–10.3)
Chloride: 105 mmol/L (ref 101–111)
GFR calc Af Amer: >60 mL/min (ref >60)
GLUCOSE: 134 mg/dL — AB (ref 65–99)
Potassium: 3.7 mmol/L (ref 3.5–5.1)
Sodium: 136 mmol/L (ref 135–145)
TOTAL PROTEIN: 6.4 g/dL — AB (ref 6.5–8.1)

## 2016-11-17 MED ORDER — PHENAZOPYRIDINE HCL 200 MG PO TABS: 200.0000 mg | ORAL_TABLET | Freq: Three times a day (TID) | ORAL | 0 refills | Status: DC

## 2016-11-17 MED ORDER — FLUCONAZOLE 150 MG PO TABS: 150.0000 mg | ORAL_TABLET | Freq: Every day | ORAL | 0 refills | Status: AC

## 2016-11-17 MED ORDER — CEPHALEXIN 500 MG PO CAPS: 500.0000 mg | ORAL_CAPSULE | Freq: Four times a day (QID) | ORAL | 0 refills | Status: DC

## 2016-11-17 NOTE — ED Notes (Signed)
Pt ambulatory to waiting room. Pt verbalized understanding of discharge instructions.   

## 2016-11-17 NOTE — ED Provider Notes (Signed)
[facility] DEPT Provider Note   CSN: [id number] Arrival date & time: 11/16/16  2222     History   Chief Complaint Chief Complaint  Patient presents with  . Dysuria    HPI Lorraine Kelly is a 51 y.o. female.  The history is provided by the patient.  Dysuria   This is a new problem. The current episode started 2 days ago. The problem occurs every urination. The problem has been gradually worsening. The quality of the pain is described as burning and aching. The pain is at a severity of 8/10. The pain is moderate. There has been no fever. She is sexually active. There is no history of pyelonephritis. Associated symptoms include chills, frequency and urgency. Pertinent negatives include no nausea, no vomiting, no discharge, no hematuria, no hesitancy, no possible pregnancy and no flank pain. Associated symptoms comments: Low back pain. Burning pain and sensation of incomplete emptying of bladder.. Her past medical history is significant for recurrent UTIs. Her past medical history does not include kidney stones or catheterization.    Past Medical History:  Diagnosis Date  . Allergy   . Complication of anesthesia    waking up too soon; severe itching and rash  . Depression   . GERD (gastroesophageal reflux disease)   . Plantar fasciitis   . PONV (postoperative nausea and vomiting)     Patient Active Problem List   Diagnosis Date Noted  . Heavy menses 11/17/2014  . Premenstrual symptom 11/17/2014  . 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 Smoker 07/29/2013  . GERD (gastroesophageal reflux disease) 07/29/2013  . Seasonal allergies 07/29/2013    Past Surgical History:  Procedure Laterality Date  . DILITATION & CURRETTAGE/HYSTROSCOPY WITH NOVASURE ABLATION N/A 11/17/2014   Procedure: HYSTEROSCOPY, UTERINE CURETTAGE, ENDOMETRIAL ABLATION USING NOVASURE;  Surgeon: Jonnie Kind, MD;  Location: [facility];  Service: Gynecology;  Laterality: N/A;  Uterine Cavity Length  5.0cm Uterine Cavity Width 4.3cm Power 118 Time 1 minute 18 seconds  . TUBAL LIGATION  12/1988  . WISDOM TOOTH EXTRACTION Bilateral 1996    OB History    No data available       Home Medications    Prior to Admission medications   Medication Sig Start Date End Date Taking? Authorizing Provider  cetirizine (ZYRTEC) 10 MG tablet Take 10 mg by mouth daily.   Yes [provider]  omeprazole (PRILOSEC OTC) 20 MG tablet Take 20 mg by mouth daily.   Yes [provider]  cephALEXin (KEFLEX) 500 MG capsule Take 1 capsule (500 mg total) by mouth 4 (four) times daily. 11/17/16   Evalee Jefferson, PA-C  phenazopyridine (PYRIDIUM) 200 MG tablet Take 1 tablet (200 mg total) by mouth 3 (three) times daily. 11/17/16   Evalee Jefferson, PA-C    Family History Family History  Problem Relation Age of Onset  . Hypertension Mother   . Hypertension Father   . ADD / ADHD Sister   . Depression Sister   . Diabetes Daughter   . Diabetes Maternal Grandmother   . Heart disease Maternal Grandmother   . Cancer Maternal Grandfather        throat, prostate  . Hypertension Maternal Grandfather   . Diabetes Paternal Grandmother   . Hypertension Paternal Grandmother   . Arthritis Paternal Grandmother   . ADD / ADHD Sister     Social History Social History  Substance Use Topics  . Smoking status: Former Smoker    Packs/day: 0.75    Years: 18.00  Types: Cigarettes  . Smokeless tobacco: Never Used  . Alcohol use No     Allergies   Patient has no known allergies.   Review of Systems Review of Systems  Constitutional: Positive for chills.  Gastrointestinal: Negative for nausea and vomiting.  Genitourinary: Positive for dysuria, frequency and urgency. Negative for flank pain, hematuria and hesitancy.     Physical Exam Updated Vital Signs BP 119/66 (BP Location: Right Arm)   Pulse (!) 103   Temp 98.4 F (36.9 C) (Oral)   Resp 17   Ht 5\' 6"  (1.676 m)   Wt 88.5 kg (195 lb)    LMP  (LMP Unknown)   SpO2 98%   BMI 31.47 kg/m   Physical Exam  Constitutional: She appears well-developed and well-nourished.  HENT:  Head: Normocephalic and atraumatic.  Eyes: Conjunctivae are normal.  Neck: Normal range of motion.  Cardiovascular: Normal rate, regular rhythm, normal heart sounds and intact distal pulses.   Pulmonary/Chest: Effort normal and breath sounds normal. She has no wheezes.  Abdominal: Soft. Bowel sounds are normal. She exhibits no distension. There is tenderness in the suprapubic area. There is no rebound, no guarding and no CVA tenderness.  Mild tenderness to palpation in the suprapubic region, no guarding or rebound.  Musculoskeletal: Normal range of motion.  Neurological: She is alert.  Skin: Skin is warm and dry.  Psychiatric: She has a normal mood and affect.  Nursing note and vitals reviewed.    ED Treatments / Results  Labs (all labs ordered are listed, but only abnormal results are displayed) Labs Reviewed  URINALYSIS, ROUTINE W REFLEX MICROSCOPIC - Abnormal; Notable for the following:       Result Value   APPearance CLOUDY (*)    Hgb urine dipstick LARGE (*)    Protein, ur 100 (*)    Nitrite POSITIVE (*)    Leukocytes, UA LARGE (*)    Bacteria, UA RARE (*)    Squamous Epithelial / LPF 0-5 (*)    All other components within normal limits  CBC WITH DIFFERENTIAL/PLATELET - Abnormal; Notable for the following:    WBC 12.1 (*)    Neutro Abs 8.5 (*)    All other components within normal limits  COMPREHENSIVE METABOLIC PANEL - Abnormal; Notable for the following:    Glucose, Bld 134 (*)    Calcium 8.2 (*)    Total Protein 6.4 (*)    Albumin 3.4 (*)    Total Bilirubin 0.2 (*)    All other components within normal limits  URINE CULTURE  PREGNANCY, URINE    EKG  EKG Interpretation None       Radiology No results found.  Procedures Procedures (including critical care time)  Medications Ordered in ED Medications  cefTRIAXone  (ROCEPHIN) 1 g in dextrose 5 % 50 mL IVPB (1 g Intravenous New Bag/Given 11/17/16 0011)  sodium chloride 0.9 % bolus 1,000 mL (1,000 mLs Intravenous New Bag/Given 11/16/16 2350)  ondansetron (ZOFRAN) injection 4 mg (4 mg Intravenous Given 11/16/16 2350)  phenazopyridine (PYRIDIUM) tablet 200 mg (200 mg Oral Given 11/16/16 2350)  ketorolac (TORADOL) 30 MG/ML injection 30 mg (30 mg Intravenous Given 11/16/16 2350)     Initial Impression / Assessment and Plan / ED Course  I have reviewed the triage vital signs and the nursing notes.  Pertinent labs & imaging results that were available during my care of the patient were reviewed by me and considered in my medical decision making (see chart for details).  Patient with symptoms and urinary findings suggesting UTI, no suggestion of kidney stone with no flank pain, just mild bilateral low aching back pain.  She is given a dose of Rocephin here, pyridium.  Will prescribe Keflex in addition to additional pyridium.  Increase fluids, recheck here or by pcp if sx worsen, return precautions discussed.    Prior to dc, pt asked for diflucan, has frequent yeast  Vaginitis when takes abx, this was prescribed.   Final Clinical Impressions(s) / ED Diagnoses   Final diagnoses:  Acute cystitis with hematuria    New Prescriptions New Prescriptions   CEPHALEXIN (KEFLEX) 500 MG CAPSULE    Take 1 capsule (500 mg total) by mouth 4 (four) times daily.   FLUCONAZOLE (DIFLUCAN) 150 MG TABLET    Take 1 tablet (150 mg total) by mouth daily.   PHENAZOPYRIDINE (PYRIDIUM) 200 MG TABLET    Take 1 tablet (200 mg total) by mouth 3 (three) times daily.     Evalee Jefferson, PA-C 11/17/16 0036    Evalee Jefferson, PA-C 64/15/83 0940    Delora Fuel, MD 76/80/88 [phone]

## 2016-11-19 LAB — URINE CULTURE
Culture: >=100000 — AB
SPECIAL REQUESTS: NORMAL

## 2016-11-20 ENCOUNTER — Telehealth: Payer: Self-pay | Admitting: Emergency Medicine

## 2016-11-20 NOTE — Telephone Encounter (Signed)
Post ED Visit - Positive Culture Follow-up: Successful Patient Follow-Up  Culture assessed and recommendations reviewed by: [x]  Elenor Quinones, Pharm.D. []  Heide Guile, Pharm.D., BCPS AQ-ID []  Parks Neptune, Pharm.D., BCPS []  Alycia Rossetti, Pharm.D., BCPS []  [place], Pharm.D., BCPS, AAHIVP []  Legrand Como, Pharm.D., BCPS, AAHIVP []  Salome Arnt, PharmD, BCPS []  Dimitri Ped, PharmD, BCPS []  Vincenza Hews, PharmD, BCPS  Positive urine culture  []  Patient discharged without antimicrobial prescription and treatment is now indicated [x]  Organism is resistant to prescribed ED discharge antimicrobial []  Patient with positive blood cultures  Changes discussed with ED provider: Alferd Apa PA New antibiotic pres:  Stop Keflex, start Bactrim DS 1 tablet po bid x 3 days  Attempting to contact patient   Hazle Nordmann 11/20/2016, 12:48 PM

## 2016-11-20 NOTE — Progress Notes (Signed)
ED Antimicrobial Stewardship Positive Culture Follow Up   Lorraine Kelly is an 51 y.o. female who presented to [facility] on 11/16/2016 with a chief complaint of  Chief Complaint  Patient presents with  . Dysuria    Recent Results (from the past 720 hour(s))  Urine Culture     Status: Abnormal   Collection Time: 11/16/16 11:06 PM  Result Value Ref Range Status   Specimen Description URINE, CLEAN CATCH  Final   Special Requests Normal  Final   Culture >=100,000 COLONIES/mL ENTEROBACTER AEROGENES (A)  Final   Report Status 11/19/2016 FINAL  Final   Organism ID, Bacteria ENTEROBACTER AEROGENES (A)  Final      Susceptibility   Enterobacter aerogenes - MIC*    CEFAZOLIN >=64 RESISTANT Resistant     CEFTRIAXONE <=1 SENSITIVE Sensitive     CIPROFLOXACIN <=0.25 SENSITIVE Sensitive     GENTAMICIN <=1 SENSITIVE Sensitive     IMIPENEM 1 SENSITIVE Sensitive     NITROFURANTOIN 64 INTERMEDIATE Intermediate     TRIMETH/SULFA <=20 SENSITIVE Sensitive     PIP/TAZO <=4 SENSITIVE Sensitive     * >=100,000 COLONIES/mL ENTEROBACTER AEROGENES    [x]  Treated with Keflex, organism resistant to prescribed antimicrobial  New antibiotic prescription: Stop Keflex, start Bactrim 1 DS tablet PO BID x 3 days  ED Provider: Alferd Apa PA-C   Reginia Naas 11/20/2016, 9:27 AM Infectious Diseases Pharmacist Phone# [phone]

## 2016-11-27 ENCOUNTER — Telehealth (HOSPITAL_BASED_OUTPATIENT_CLINIC_OR_DEPARTMENT_OTHER): Payer: Self-pay

## 2016-11-27 NOTE — Telephone Encounter (Signed)
Pt calling after receiving letter.  ID verified.  Pt informed of need for new abx.  Rx for Bactrim DS po BID x 3 days written by Alferd Apa PA called in and given to [facility] @ [facility] [phone].

## 2016-11-30 ENCOUNTER — Emergency Department (HOSPITAL_COMMUNITY): Payer: 59

## 2016-11-30 ENCOUNTER — Encounter (HOSPITAL_COMMUNITY): Payer: Self-pay | Admitting: *Deleted

## 2016-11-30 ENCOUNTER — Inpatient Hospital Stay (HOSPITAL_COMMUNITY)
Admission: EM | Admit: 2016-11-30 | Discharge: 2016-12-03 | DRG: 392 | Disposition: A | Discharge status: Home / Self Care | Payer: 59 | Attending: Internal Medicine | Admitting: Internal Medicine

## 2016-11-30 DIAGNOSIS — N390 Urinary tract infection, site not specified: Secondary | ICD-10-CM | POA: Diagnosis present

## 2016-11-30 DIAGNOSIS — Z792 Long term (current) use of antibiotics: Secondary | ICD-10-CM | POA: Diagnosis not present

## 2016-11-30 DIAGNOSIS — K5792 Diverticulitis of intestine, part unspecified, without perforation or abscess without bleeding: Secondary | ICD-10-CM

## 2016-11-30 DIAGNOSIS — R109 Unspecified abdominal pain: Secondary | ICD-10-CM | POA: Diagnosis not present

## 2016-11-30 DIAGNOSIS — R1084 Generalized abdominal pain: Secondary | ICD-10-CM | POA: Diagnosis not present

## 2016-11-30 DIAGNOSIS — T7840XA Allergy, unspecified, initial encounter: Secondary | ICD-10-CM | POA: Insufficient documentation

## 2016-11-30 DIAGNOSIS — K219 Gastro-esophageal reflux disease without esophagitis: Secondary | ICD-10-CM | POA: Diagnosis present

## 2016-11-30 DIAGNOSIS — R1032 Left lower quadrant pain: Secondary | ICD-10-CM | POA: Diagnosis not present

## 2016-11-30 DIAGNOSIS — K5732 Diverticulitis of large intestine without perforation or abscess without bleeding: Secondary | ICD-10-CM | POA: Diagnosis not present

## 2016-11-30 DIAGNOSIS — R1030 Lower abdominal pain, unspecified: Secondary | ICD-10-CM | POA: Diagnosis not present

## 2016-11-30 DIAGNOSIS — Z79899 Other long term (current) drug therapy: Secondary | ICD-10-CM

## 2016-11-30 DIAGNOSIS — E669 Obesity, unspecified: Secondary | ICD-10-CM | POA: Diagnosis present

## 2016-11-30 DIAGNOSIS — Z87891 Personal history of nicotine dependence: Secondary | ICD-10-CM | POA: Diagnosis not present

## 2016-11-30 LAB — URINALYSIS, ROUTINE W REFLEX MICROSCOPIC
Bacteria, UA: NONE SEEN
Bilirubin Urine: NEGATIVE
GLUCOSE, UA: NEGATIVE mg/dL
Ketones, ur: NEGATIVE mg/dL
LEUKOCYTES UA: NEGATIVE
Nitrite: NEGATIVE
PH: 5 (ref 5.0–8.0)
Protein, ur: NEGATIVE mg/dL
SPECIFIC GRAVITY, URINE: 1.019 (ref 1.005–1.030)

## 2016-11-30 LAB — COMPREHENSIVE METABOLIC PANEL
ALK PHOS: 68 U/L (ref 38–126)
ALT: 16 U/L (ref 14–54)
AST: 15 U/L (ref 15–41)
Albumin: 3.7 g/dL (ref 3.5–5.0)
Anion gap: 9 (ref 5–15)
BUN: 11 mg/dL (ref 6–20)
CALCIUM: 9.1 mg/dL (ref 8.9–10.3)
CHLORIDE: 105 mmol/L (ref 101–111)
CO2: 27 mmol/L (ref 22–32)
CREATININE: 0.94 mg/dL (ref 0.44–1.00)
Glucose, Bld: 115 mg/dL — ABNORMAL HIGH (ref 65–99)
Potassium: 3.9 mmol/L (ref 3.5–5.1)
Sodium: 141 mmol/L (ref 135–145)
Total Bilirubin: 0.2 mg/dL — ABNORMAL LOW (ref 0.3–1.2)
Total Protein: 6.7 g/dL (ref 6.5–8.1)

## 2016-11-30 LAB — CBC
HCT: 39.6 % (ref 36.0–46.0)
Hemoglobin: 13 g/dL (ref 12.0–15.0)
MCH: 29.8 pg (ref 26.0–34.0)
MCHC: 32.8 g/dL (ref 30.0–36.0)
MCV: 90.8 fL (ref 78.0–100.0)
Platelets: 322 10*3/uL (ref 150–400)
RBC: 4.36 MIL/uL (ref 3.87–5.11)
RDW: 13.6 % (ref 11.5–15.5)
WBC: 10 10*3/uL (ref 4.0–10.5)

## 2016-11-30 LAB — LIPASE, BLOOD: LIPASE: 23 U/L (ref 11–51)

## 2016-11-30 MED ORDER — ONDANSETRON HCL 4 MG/2ML IJ SOLN
4.0000 mg | INTRAMUSCULAR | Status: DC | PRN
  Administered 2016-11-30: 4 mg via INTRAVENOUS
  Filled 2016-11-30 (×4): qty 2

## 2016-11-30 MED ORDER — SODIUM CHLORIDE 0.9 % IV SOLN: INTRAVENOUS | Status: DC

## 2016-11-30 MED ORDER — FAMOTIDINE IN NACL 20-0.9 MG/50ML-% IV SOLN
20.0000 mg | Freq: Once | INTRAVENOUS | Status: AC
  Administered 2016-11-30: 20 mg via INTRAVENOUS
  Filled 2016-11-30: qty 50

## 2016-11-30 MED ORDER — DICYCLOMINE HCL 10 MG/ML IM SOLN
20.0000 mg | Freq: Once | INTRAMUSCULAR | Status: AC
  Administered 2016-11-30: 20 mg via INTRAMUSCULAR
  Filled 2016-11-30: qty 2

## 2016-11-30 MED ORDER — FENTANYL CITRATE (PF) 100 MCG/2ML IJ SOLN
25.0000 ug | INTRAMUSCULAR | Status: DC | PRN
  Administered 2016-11-30 – 2016-12-02 (×7): 25 ug via INTRAVENOUS
  Filled 2016-11-30 (×7): qty 2

## 2016-11-30 MED ORDER — IOPAMIDOL (ISOVUE-300) INJECTION 61%
100.0000 mL | Freq: Once | INTRAVENOUS | Status: AC | PRN
  Administered 2016-11-30: 100 mL via INTRAVENOUS

## 2016-11-30 NOTE — H&P (Signed)
History and Physical    Lorraine Kelly:[id number] DOB: [birth year] DOA: 11/30/2016  PCP: Sharilyn Sites, MD  Patient coming from: Home.    Chief Complaint:  Abdominal pain intermittently for the past week.   HPI: Lorraine Kelly is an 51 y.o. female with hx of allergy, GERD, depression, presented to the ER with persistent abdominal pain for a few days.  She had started pain about a week ago, gotten better, and pain now returned.  She has nausea, but still has some appetite.  No black or bloody stool.  She has some subjective fever, no chills.  No distant travel, new meds, or ill contact.  Evalution in the ER showed no temperature, normal WBC, but her abdominal pelvic CT showed stranding suggestive of early appendicitis, and also queried early diverticulitis.  EDP consulted Dr Arnoldo Morale, and hospitalist was asked to admit her for the abdominal pelvic CT findings.   ED Course:  See above.  Rewiew of Systems:  Constitutional: Negative for malaise, fever and chills. No significant weight loss or weight gain Eyes: Negative for eye pain, redness and discharge, diplopia, visual changes, or flashes of light. ENMT: Negative for ear pain, hoarseness, nasal congestion, sinus pressure and sore throat. No headaches; tinnitus, drooling, or problem swallowing. Cardiovascular: Negative for chest pain, palpitations, diaphoresis, dyspnea and peripheral edema. ; No orthopnea, PND Respiratory: Negative for cough, hemoptysis, wheezing and stridor. No pleuritic chestpain. Gastrointestinal: Negative for diarrhea, constipation,  melena, blood in stool, hematemesis, jaundice and rectal bleeding.    Genitourinary: Negative for frequency, dysuria, incontinence,flank pain and hematuria; Musculoskeletal: Negative for back pain and neck pain. Negative for swelling and trauma.;  Skin: . Negative for pruritus, rash, abrasions, bruising and skin lesion.; ulcerations Neuro: Negative for headache, lightheadedness and neck  stiffness. Negative for weakness, altered level of consciousness , altered mental status, extremity weakness, burning feet, involuntary movement, seizure and syncope.  Psych: negative for anxiety, depression, insomnia, tearfulness, panic attacks, hallucinations, paranoia, suicidal or homicidal ideation    Past Medical History:  Diagnosis Date  . Allergy   . Complication of anesthesia    waking up too soon; severe itching and rash  . Depression   . GERD (gastroesophageal reflux disease)   . Plantar fasciitis   . PONV (postoperative nausea and vomiting)     Past Surgical History:  Procedure Laterality Date  . DILITATION & CURRETTAGE/HYSTROSCOPY WITH NOVASURE ABLATION N/A 11/17/2014   Procedure: HYSTEROSCOPY, UTERINE CURETTAGE, ENDOMETRIAL ABLATION USING NOVASURE;  Surgeon: Jonnie Kind, MD;  Location: [facility];  Service: Gynecology;  Laterality: N/A;  Uterine Cavity Length 5.0cm Uterine Cavity Width 4.3cm Power 118 Time 1 minute 18 seconds  . TUBAL LIGATION  12/1988  . WISDOM TOOTH EXTRACTION Bilateral 1996     reports that she has quit smoking. Her smoking use included Cigarettes. She has a 13.50 pack-year smoking history. She has never used smokeless tobacco. She reports that she does not drink alcohol or use drugs.  No Known Allergies  Family History  Problem Relation Age of Onset  . Hypertension Mother   . Hypertension Father   . ADD / ADHD Sister   . Depression Sister   . Diabetes Daughter   . Diabetes Maternal Grandmother   . Heart disease Maternal Grandmother   . Cancer Maternal Grandfather        throat, prostate  . Hypertension Maternal Grandfather   . Diabetes Paternal Grandmother   . Hypertension Paternal Grandmother   . Arthritis Paternal Grandmother   .  ADD / ADHD Sister      Prior to Admission medications   Medication Sig Start Date End Date Taking? Authorizing Provider  cetirizine (ZYRTEC) 10 MG tablet Take 10 mg by mouth daily.   Yes [provider]  omeprazole (PRILOSEC OTC) 20 MG tablet Take 20 mg by mouth daily.   Yes [provider]  cephALEXin (KEFLEX) 500 MG capsule Take 1 capsule (500 mg total) by mouth 4 (four) times daily. Patient not taking: Reported on 11/30/2016 11/17/16   Evalee Jefferson, PA-C  phenazopyridine (PYRIDIUM) 200 MG tablet Take 1 tablet (200 mg total) by mouth 3 (three) times daily. Patient not taking: Reported on 11/30/2016 11/17/16   Evalee Jefferson, PA-C  sulfamethoxazole-trimethoprim (BACTRIM DS,SEPTRA DS) 800-160 MG tablet Take 1 tablet by mouth daily. 3 day course starting on 11/27/16 11/27/16   [provider]    Physical Exam: Vitals:   11/30/16 1926 11/30/16 2243  BP: 116/82 113/65  Pulse: 95 89  Resp: 20 18  Temp: 98.4 F (36.9 C) 98.2 F (36.8 C)  TempSrc: Oral Oral  SpO2: 97% 97%  Weight: 88.5 kg (195 lb)   Kelly: 5\' 6"  (1.676 m)     Constitutional: NAD, calm, comfortable Vitals:   11/30/16 1926 11/30/16 2243  BP: 116/82 113/65  Pulse: 95 89  Resp: 20 18  Temp: 98.4 F (36.9 C) 98.2 F (36.8 C)  TempSrc: Oral Oral  SpO2: 97% 97%  Weight: 88.5 kg (195 lb)   Kelly: 5\' 6"  (1.676 m)    Eyes: PERRL, lids and conjunctivae normal ENMT: Mucous membranes are moist. Posterior pharynx clear of any exudate or lesions.Normal dentition.  Neck: normal, supple, no masses, no thyromegaly Respiratory: clear to auscultation bilaterally, no wheezing, no crackles. Normal respiratory effort. No accessory muscle use.  Cardiovascular: Regular rate and rhythm, no murmurs / rubs / gallops. No extremity edema. 2+ pedal pulses. No carotid bruits.  Abdomen: no tenderness, no masses palpated. No hepatosplenomegaly. Bowel sounds positive.  Musculoskeletal: no clubbing / cyanosis. No joint deformity upper and lower extremities. Good ROM, no contractures. Normal muscle tone.  Skin: no rashes, lesions, ulcers. No induration Neurologic: CN 2-12 grossly intact. Sensation intact, DTR normal.  Strength 5/5 in all 4.  Psychiatric: Normal judgment and insight. Alert and oriented x 3. Normal mood.   Labs on Admission: I have personally reviewed following labs and imaging studies  CBC:  Recent Labs Lab 11/30/16 2004  WBC 10.0  HGB 13.0  HCT 39.6  MCV 90.8  PLT 710   Basic Metabolic Panel:  Recent Labs Lab 11/30/16 2004  NA 141  K 3.9  CL 105  CO2 27  GLUCOSE 115*  BUN 11  CREATININE 0.94  CALCIUM 9.1   GFR: Estimated Creatinine Clearance: 81.1 mL/min (by C-G formula based on SCr of 0.94 mg/dL). Liver Function Tests:  Recent Labs Lab 11/30/16 2004  AST 15  ALT 16  ALKPHOS 68  BILITOT 0.2*  PROT 6.7  ALBUMIN 3.7    Recent Labs Lab 11/30/16 2004  LIPASE 23   Urine analysis:    Component Value Date/Time   COLORURINE YELLOW 11/30/2016 1928   APPEARANCEUR CLEAR 11/30/2016 1928   LABSPEC 1.019 11/30/2016 1928   PHURINE 5.0 11/30/2016 1928   GLUCOSEU NEGATIVE 11/30/2016 1928   HGBUR SMALL (A) 11/30/2016 1928   BILIRUBINUR NEGATIVE 11/30/2016 1928   KETONESUR NEGATIVE 11/30/2016 1928   PROTEINUR NEGATIVE 11/30/2016 1928   UROBILINOGEN 0.2 11/14/2014 1030   NITRITE NEGATIVE 11/30/2016  [place] 11/30/2016 1928   Radiological Exams on Admission: Dg Chest 2 View  Result Date: 11/30/2016 CLINICAL DATA:  51 year old female with abdominal pain. EXAM: CHEST  2 VIEW COMPARISON:  Chest radiograph dated 03/20/2016 FINDINGS: The heart size and mediastinal contours are within normal limits. Both lungs are clear. The visualized skeletal structures are unremarkable. IMPRESSION: No active cardiopulmonary disease. Electronically Signed   By: Anner Crete M.D.   On: 11/30/2016 22:37   Ct Abdomen Pelvis W Contrast  Result Date: 11/30/2016 CLINICAL DATA:  Recent urinary tract infection. Three days of abdominal and back pain with nausea. Patient complains of initial right upper quadrant abdominal pain that began generalized. Small amount of  blood in urine. EXAM: CT ABDOMEN AND PELVIS WITH CONTRAST TECHNIQUE: Multidetector CT imaging of the abdomen and pelvis was performed using the standard protocol following bolus administration of intravenous contrast. CONTRAST:  159mL ISOVUE-300 IOPAMIDOL (ISOVUE-300) INJECTION 61% COMPARISON:  None. FINDINGS: Lower chest: No acute abnormality. Hepatobiliary: No focal liver abnormality is seen. No gallstones, gallbladder wall thickening, or biliary dilatation. Pancreas: Unremarkable. No pancreatic ductal dilatation or surrounding inflammatory changes. Spleen: Normal in size without focal abnormality. Adrenals/Urinary Tract: Adrenal glands appear normal. Kidneys appear normal without mass, stone or hydronephrosis. No perinephric inflammation. No ureteral or bladder calculi identified. Bladder appears normal. Stomach/Bowel: Bowel is normal in caliber. Scattered diverticulosis within the descending and sigmoid colon. Subtle inflammation about the lower descending colon suspicious for acute diverticulitis. Distal appendix is mildly prominent measuring 8 mm diameter, with questionable periappendiceal stranding (axial series 2, image 65 and 66 ; coronal series 5, image 61 and 62) Vascular/Lymphatic: No significant vascular findings are present. No enlarged abdominal or pelvic lymph nodes. Reproductive: Uterus and bilateral adnexa are unremarkable. Other: No free fluid or abscess collection. No free intraperitoneal air. Musculoskeletal: Mild degenerative spurring in the lumbar spine. No acute or suspicious osseous finding. Superficial soft tissues are unremarkable. IMPRESSION: 1. Distal appendix is mildly prominent in size, measuring 8 mm diameter, raising the possibility of an early/mild tip appendicitis. Questionable associated periappendiceal stranding, but not convincing. 2. Also, subtle inflammation about the lower descending colon, with diverticulosis, suspicious for early/mild diverticulitis. 3. Remainder of the  abdomen and pelvis CT is unremarkable, as detailed above. At minimum, recommend close clinical follow-up and short-term follow-up CT as needed to exclude very early developing appendicitis and/or diverticulitis. Electronically Signed   By: Franki Cabot M.D.   On: 11/30/2016 22:28    EKG: Independently reviewed.  Assessment/Plan Principal Problem:   Abdominal pain Active Problems:   Allergy    PLAN:   Abdominal pain:  Her presentation is atypical for appendicitis and/or diverticulitis.  She has no temperature here, and no leukocytosis.  However, her image showed possible appendicitis or diverticulitis.  Will admit for IV antibiotics, NPO, Tx symptoms, and give IVF.  Have consulted Dr Arnoldo Morale and await further recommendation.  She is very stable.   Thank you and Good Day.   DVT prophylaxis: subQ heparin.  Code Status: FULL CODE.  Family Communication: bf at bedside.  Disposition Plan: To home.  Consults called: Dr Arnoldo Morale.  Admission status: Inpatient.    Bailie Christenbury MD FACP. [facility]  If 7PM-7AM, please contact night-coverage [email or web address] Password [facility]  11/30/2016, 11:35 PM

## 2016-11-30 NOTE — ED Notes (Signed)
Pt to CT and radiology.

## 2016-11-30 NOTE — ED Notes (Signed)
Dr McM in to assess 

## 2016-11-30 NOTE — ED Provider Notes (Signed)
[facility] DEPT Provider Note   CSN: [id number] Arrival date & time: 11/30/16  1819     History   Chief Complaint Chief Complaint  Patient presents with  . Abdominal Pain    HPI Lorraine Kelly is a 51 y.o. female.  HPI  Pt was seen at 2055.  Per pt, c/o gradual onset and persistence of constant generalized abd "pain" since yesterday.  Has been associated with nausea.  Describes the abd pain as "bloating." Pt states she had similar symptoms 1 week ago, which resolved after 3 days. Pt was recently dx with UTI and finished her antibiotic today.  Denies vomiting/diarrhea, no fevers, no back pain, no rash, no CP/SOB, no black or blood in stools, no dysuria.      Past Medical History:  Diagnosis Date  . Allergy   . Complication of anesthesia    waking up too soon; severe itching and rash  . Depression   . GERD (gastroesophageal reflux disease)   . Plantar fasciitis   . PONV (postoperative nausea and vomiting)     Patient Active Problem List   Diagnosis Date Noted  . Heavy menses 11/17/2014  . Premenstrual symptom 11/17/2014  . 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 Smoker 07/29/2013  . GERD (gastroesophageal reflux disease) 07/29/2013  . Seasonal allergies 07/29/2013    Past Surgical History:  Procedure Laterality Date  . DILITATION & CURRETTAGE/HYSTROSCOPY WITH NOVASURE ABLATION N/A 11/17/2014   Procedure: HYSTEROSCOPY, UTERINE CURETTAGE, ENDOMETRIAL ABLATION USING NOVASURE;  Surgeon: Jonnie Kind, MD;  Location: [facility];  Service: Gynecology;  Laterality: N/A;  Uterine Cavity Length 5.0cm Uterine Cavity Width 4.3cm Power 118 Time 1 minute 18 seconds  . TUBAL LIGATION  12/1988  . WISDOM TOOTH EXTRACTION Bilateral 1996    OB History    No data available       Home Medications    Prior to Admission medications   Medication Sig Start Date End Date Taking? Authorizing Provider  cetirizine (ZYRTEC) 10 MG tablet Take 10 mg by mouth daily.   Yes [provider]  omeprazole (PRILOSEC OTC) 20 MG tablet Take 20 mg by mouth daily.   Yes [provider]  cephALEXin (KEFLEX) 500 MG capsule Take 1 capsule (500 mg total) by mouth 4 (four) times daily. Patient not taking: Reported on 11/30/2016 11/17/16   Evalee Jefferson, PA-C  phenazopyridine (PYRIDIUM) 200 MG tablet Take 1 tablet (200 mg total) by mouth 3 (three) times daily. Patient not taking: Reported on 11/30/2016 11/17/16   Evalee Jefferson, PA-C  sulfamethoxazole-trimethoprim (BACTRIM DS,SEPTRA DS) 800-160 MG tablet Take 1 tablet by mouth daily. 3 day course starting on 11/27/16 11/27/16   [provider]    Family History Family History  Problem Relation Age of Onset  . Hypertension Mother   . Hypertension Father   . ADD / ADHD Sister   . Depression Sister   . Diabetes Daughter   . Diabetes Maternal Grandmother   . Heart disease Maternal Grandmother   . Cancer Maternal Grandfather        throat, prostate  . Hypertension Maternal Grandfather   . Diabetes Paternal Grandmother   . Hypertension Paternal Grandmother   . Arthritis Paternal Grandmother   . ADD / ADHD Sister     Social History Social History  Substance Use Topics  . Smoking status: Former Smoker    Packs/day: 0.75    Years: 18.00    Types: Cigarettes  . Smokeless tobacco: Never Used  .  Alcohol use No     Allergies   Patient has no known allergies.   Review of Systems Review of Systems ROS: Statement: All systems negative except as marked or noted in the HPI; Constitutional: Negative for fever and chills. ; ; Eyes: Negative for eye pain, redness and discharge. ; ; ENMT: Negative for ear pain, hoarseness, nasal congestion, sinus pressure and sore throat. ; ; Cardiovascular: Negative for chest pain, palpitations, diaphoresis, dyspnea and peripheral edema. ; ; Respiratory: Negative for cough, wheezing and stridor. ; ; Gastrointestinal: +nausea, abd pain. Negative for vomiting, diarrhea, blood in stool,  hematemesis, jaundice and rectal bleeding. . ; ; Genitourinary: Negative for dysuria, flank pain. ; ; Musculoskeletal: Negative for back pain and neck pain. Negative for swelling and trauma.; ; Skin: Negative for pruritus, rash, abrasions, blisters, bruising and skin lesion.; ; Neuro: Negative for headache, lightheadedness and neck stiffness. Negative for weakness, altered level of consciousness, altered mental status, extremity weakness, paresthesias, involuntary movement, seizure and syncope.       Physical Exam Updated Vital Signs BP 116/82 (BP Location: Right Arm)   Pulse 95   Temp 98.4 F (36.9 C) (Oral)   Resp 20   Ht 5\' 6"  (1.676 m)   Wt 88.5 kg (195 lb)   LMP  (LMP Unknown)   SpO2 97%   BMI 31.47 kg/m   Physical Exam 2100: Physical examination:  Nursing notes reviewed; Vital signs and O2 SAT reviewed;  Constitutional: Well developed, Well nourished, Well hydrated, In no acute distress; Head:  Normocephalic, atraumatic; Eyes: EOMI, PERRL, No scleral icterus; ENMT: Mouth and pharynx normal, Mucous membranes moist; Neck: Supple, Full range of motion, No lymphadenopathy; Cardiovascular: Regular rate and rhythm, No gallop; Respiratory: Breath sounds clear & equal bilaterally, No wheezes.  Speaking full sentences with ease, Normal respiratory effort/excursion; Chest: Nontender, Movement normal; Abdomen: Soft, +mild diffuse tenderness to palp. No rebound or guarding. Nondistended, Normal bowel sounds; Genitourinary: No CVA tenderness; Extremities: Pulses normal, No tenderness, No edema, No calf edema or asymmetry.; Neuro: AA&Ox3, Major CN grossly intact.  Speech clear. No gross focal motor or sensory deficits in extremities.; Skin: Color normal, Warm, Dry.   ED Treatments / Results  Labs (all labs ordered are listed, but only abnormal results are displayed)   EKG  EKG Interpretation None       Radiology   Procedures Procedures (including critical care time)  Medications  Ordered in ED Medications  famotidine (PEPCID) IVPB 20 mg premix (not administered)  ondansetron (ZOFRAN) injection 4 mg (not administered)  dicyclomine (BENTYL) injection 20 mg (not administered)     Initial Impression / Assessment and Plan / ED Course  I have reviewed the triage vital signs and the nursing notes.  Pertinent labs & imaging results that were available during my care of the patient were reviewed by me and considered in my medical decision making (see chart for details).  MDM Reviewed: previous chart, nursing note and vitals Reviewed previous: labs Interpretation: labs, CT scan and x-ray   Results for orders placed or performed during the hospital encounter of 11/30/16  Lipase, blood  Result Value Ref Range   Lipase 23 11 - 51 U/L  Comprehensive metabolic panel  Result Value Ref Range   Sodium 141 135 - 145 mmol/L   Potassium 3.9 3.5 - 5.1 mmol/L   Chloride 105 101 - 111 mmol/L   CO2 27 22 - 32 mmol/L   Glucose, Bld 115 (H) 65 - 99 mg/dL  BUN 11 6 - 20 mg/dL   Creatinine, Ser 0.94 0.44 - 1.00 mg/dL   Calcium 9.1 8.9 - 10.3 mg/dL   Total Protein 6.7 6.5 - 8.1 g/dL   Albumin 3.7 3.5 - 5.0 g/dL   AST 15 15 - 41 U/L   ALT 16 14 - 54 U/L   Alkaline Phosphatase 68 38 - 126 U/L   Total Bilirubin 0.2 (L) 0.3 - 1.2 mg/dL   GFR calc non Af Amer >60 >60 mL/min   GFR calc Af Amer >60 >60 mL/min   Anion gap 9 5 - 15  CBC  Result Value Ref Range   WBC 10.0 4.0 - 10.5 K/uL   RBC 4.36 3.87 - 5.11 MIL/uL   Hemoglobin 13.0 12.0 - 15.0 g/dL   HCT 39.6 36.0 - 46.0 %   MCV 90.8 78.0 - 100.0 fL   MCH 29.8 26.0 - 34.0 pg   MCHC 32.8 30.0 - 36.0 g/dL   RDW 13.6 11.5 - 15.5 %   Platelets 322 150 - 400 K/uL  Urinalysis, Routine w reflex microscopic  Result Value Ref Range   Color, Urine YELLOW YELLOW   APPearance CLEAR CLEAR   Specific Gravity, Urine 1.019 1.005 - 1.030   pH 5.0 5.0 - 8.0   Glucose, UA NEGATIVE NEGATIVE mg/dL   Hgb urine dipstick SMALL (A) NEGATIVE     Bilirubin Urine NEGATIVE NEGATIVE   Ketones, ur NEGATIVE NEGATIVE mg/dL   Protein, ur NEGATIVE NEGATIVE mg/dL   Nitrite NEGATIVE NEGATIVE   Leukocytes, UA NEGATIVE NEGATIVE   RBC / HPF 0-5 0 - 5 RBC/hpf   WBC, UA 0-5 0 - 5 WBC/hpf   Bacteria, UA NONE SEEN NONE SEEN   Squamous Epithelial / LPF 6-30 (A) NONE SEEN   Mucous PRESENT    Ct Abdomen Pelvis W Contrast Result Date: 11/30/2016 CLINICAL DATA:  Recent urinary tract infection. Three days of abdominal and back pain with nausea. Patient complains of initial right upper quadrant abdominal pain that began generalized. Small amount of blood in urine. EXAM: CT ABDOMEN AND PELVIS WITH CONTRAST TECHNIQUE: Multidetector CT imaging of the abdomen and pelvis was performed using the standard protocol following bolus administration of intravenous contrast. CONTRAST:  126mL ISOVUE-300 IOPAMIDOL (ISOVUE-300) INJECTION 61% COMPARISON:  None. FINDINGS: Lower chest: No acute abnormality. Hepatobiliary: No focal liver abnormality is seen. No gallstones, gallbladder wall thickening, or biliary dilatation. Pancreas: Unremarkable. No pancreatic ductal dilatation or surrounding inflammatory changes. Spleen: Normal in size without focal abnormality. Adrenals/Urinary Tract: Adrenal glands appear normal. Kidneys appear normal without mass, stone or hydronephrosis. No perinephric inflammation. No ureteral or bladder calculi identified. Bladder appears normal. Stomach/Bowel: Bowel is normal in caliber. Scattered diverticulosis within the descending and sigmoid colon. Subtle inflammation about the lower descending colon suspicious for acute diverticulitis. Distal appendix is mildly prominent measuring 8 mm diameter, with questionable periappendiceal stranding (axial series 2, image 65 and 66 ; coronal series 5, image 61 and 62) Vascular/Lymphatic: No significant vascular findings are present. No enlarged abdominal or pelvic lymph nodes. Reproductive: Uterus and bilateral  adnexa are unremarkable. Other: No free fluid or abscess collection. No free intraperitoneal air. Musculoskeletal: Mild degenerative spurring in the lumbar spine. No acute or suspicious osseous finding. Superficial soft tissues are unremarkable. IMPRESSION: 1. Distal appendix is mildly prominent in size, measuring 8 mm diameter, raising the possibility of an early/mild tip appendicitis. Questionable associated periappendiceal stranding, but not convincing. 2. Also, subtle inflammation about the lower descending colon, with diverticulosis,  suspicious for early/mild diverticulitis. 3. Remainder of the abdomen and pelvis CT is unremarkable, as detailed above. At minimum, recommend close clinical follow-up and short-term follow-up CT as needed to exclude very early developing appendicitis and/or diverticulitis. Electronically Signed   By: Franki Cabot M.D.   On: 11/30/2016 22:28    2240:  CT as above. Will dose IV cipro/flagyl.  Dx and testing d/w pt and family.  Questions answered.  Verb understanding, agreeable to admit.  T/C to General Surgery Dr. Arnoldo Morale, case discussed, including:  HPI, pertinent PM/SHx, VS/PE, dx testing, ED course and treatment:  Agrees with IV abx, will consult tomorrow morning. T/C to [facility] Dr. Marin Comment, case discussed, including:  HPI, pertinent PM/SHx, VS/PE, dx testing, ED course and treatment:  Agreeable to admit.       Final Clinical Impressions(s) / ED Diagnoses   Final diagnoses:  None    New Prescriptions New Prescriptions   No medications on file     Francine Graven, DO 12/03/16 [id number]

## 2016-11-30 NOTE — ED Triage Notes (Signed)
Pt reports being seen here 2 weeks ago for a uti. Pt states they had to change her antibiotic and she just finished it today. Pt reports having 3 days spell of abdominal/back pain/nausea last week and yesterday pt began having the same symptoms. Pt c/o ruq abdominal pain that has became generalized, bloating, nausea and mid back pain. Pt denies dysuria but reports a small amount of blood in her urine.

## 2016-11-30 NOTE — ED Notes (Signed)
Pt reports that she is feeling better, "until I cough"

## 2016-11-30 NOTE — ED Notes (Signed)
DrM in to reassess and to discuss findings

## 2016-12-01 DIAGNOSIS — E669 Obesity, unspecified: Secondary | ICD-10-CM | POA: Diagnosis present

## 2016-12-01 DIAGNOSIS — R1084 Generalized abdominal pain: Secondary | ICD-10-CM

## 2016-12-01 DIAGNOSIS — K5792 Diverticulitis of intestine, part unspecified, without perforation or abscess without bleeding: Secondary | ICD-10-CM | POA: Diagnosis present

## 2016-12-01 LAB — COMPREHENSIVE METABOLIC PANEL
ALT: 14 U/L (ref 14–54)
ANION GAP: 8 (ref 5–15)
AST: 15 U/L (ref 15–41)
Albumin: 3.3 g/dL — ABNORMAL LOW (ref 3.5–5.0)
Alkaline Phosphatase: 56 U/L (ref 38–126)
BILIRUBIN TOTAL: 0.2 mg/dL — AB (ref 0.3–1.2)
BUN: 9 mg/dL (ref 6–20)
CHLORIDE: 102 mmol/L (ref 101–111)
CO2: 25 mmol/L (ref 22–32)
Calcium: 8.5 mg/dL — ABNORMAL LOW (ref 8.9–10.3)
Creatinine, Ser: 0.89 mg/dL (ref 0.44–1.00)
GFR calc non Af Amer: >60 mL/min (ref >60)
Glucose, Bld: 92 mg/dL (ref 65–99)
POTASSIUM: 3.8 mmol/L (ref 3.5–5.1)
Sodium: 135 mmol/L (ref 135–145)
TOTAL PROTEIN: 6.4 g/dL — AB (ref 6.5–8.1)

## 2016-12-01 LAB — CBC
HCT: 37.4 % (ref 36.0–46.0)
Hemoglobin: 12.5 g/dL (ref 12.0–15.0)
MCH: 30.4 pg (ref 26.0–34.0)
MCHC: 33.4 g/dL (ref 30.0–36.0)
MCV: 91 fL (ref 78.0–100.0)
PLATELETS: 309 10*3/uL (ref 150–400)
RBC: 4.11 MIL/uL (ref 3.87–5.11)
RDW: 13.6 % (ref 11.5–15.5)
WBC: 8.9 10*3/uL (ref 4.0–10.5)

## 2016-12-01 MED ORDER — HYDROCORTISONE 1 % EX CREA: TOPICAL_CREAM | Freq: Three times a day (TID) | CUTANEOUS | Status: DC | PRN

## 2016-12-01 MED ORDER — CIPROFLOXACIN IN D5W 400 MG/200ML IV SOLN
400.0000 mg | Freq: Two times a day (BID) | INTRAVENOUS | Status: DC
  Administered 2016-12-01 – 2016-12-03 (×5): 400 mg via INTRAVENOUS
  Filled 2016-12-01 (×5): qty 200

## 2016-12-01 MED ORDER — PNEUMOCOCCAL VAC POLYVALENT 25 MCG/0.5ML IJ INJ: 0.5000 mL | INJECTION | INTRAMUSCULAR | Status: DC | PRN

## 2016-12-01 MED ORDER — ACETAMINOPHEN 650 MG RE SUPP: 650.0000 mg | Freq: Four times a day (QID) | RECTAL | Status: DC | PRN

## 2016-12-01 MED ORDER — ACETAMINOPHEN 325 MG PO TABS
650.0000 mg | ORAL_TABLET | Freq: Four times a day (QID) | ORAL | Status: DC | PRN
  Administered 2016-12-01 – 2016-12-02 (×2): 650 mg via ORAL
  Filled 2016-12-01 (×2): qty 2

## 2016-12-01 MED ORDER — SODIUM CHLORIDE 0.9% FLUSH
3.0000 mL | Freq: Two times a day (BID) | INTRAVENOUS | Status: DC
  Administered 2016-12-02: 3 mL via INTRAVENOUS

## 2016-12-01 MED ORDER — PANTOPRAZOLE SODIUM 40 MG IV SOLR
40.0000 mg | INTRAVENOUS | Status: DC
  Administered 2016-12-01 – 2016-12-02 (×2): 40 mg via INTRAVENOUS
  Filled 2016-12-01 (×3): qty 40

## 2016-12-01 MED ORDER — METRONIDAZOLE IN NACL 5-0.79 MG/ML-% IV SOLN
500.0000 mg | Freq: Three times a day (TID) | INTRAVENOUS | Status: DC
  Administered 2016-12-01 – 2016-12-03 (×7): 500 mg via INTRAVENOUS
  Filled 2016-12-01 (×8): qty 100

## 2016-12-01 MED ORDER — DIPHENHYDRAMINE HCL 25 MG PO CAPS
25.0000 mg | ORAL_CAPSULE | Freq: Four times a day (QID) | ORAL | Status: DC | PRN
  Administered 2016-12-01: 25 mg via ORAL
  Filled 2016-12-01: qty 1

## 2016-12-01 MED ORDER — HEPARIN SODIUM (PORCINE) 5000 UNIT/ML IJ SOLN
5000.0000 [IU] | Freq: Three times a day (TID) | INTRAMUSCULAR | Status: DC
  Administered 2016-12-01 (×3): 5000 [IU] via SUBCUTANEOUS
  Filled 2016-12-01 (×6): qty 1

## 2016-12-01 MED ORDER — ONDANSETRON HCL 4 MG/2ML IJ SOLN
4.0000 mg | Freq: Four times a day (QID) | INTRAMUSCULAR | Status: DC | PRN
  Administered 2016-12-01 – 2016-12-02 (×3): 4 mg via INTRAVENOUS

## 2016-12-01 MED ORDER — DEXTROSE-NACL 5-0.9 % IV SOLN
INTRAVENOUS | Status: DC
  Administered 2016-12-01: 02:00:00 via INTRAVENOUS

## 2016-12-01 MED ORDER — LORATADINE 10 MG PO TABS
10.0000 mg | ORAL_TABLET | Freq: Every day | ORAL | Status: DC
  Filled 2016-12-01 (×2): qty 1

## 2016-12-01 MED ORDER — ONDANSETRON HCL 4 MG PO TABS: 4.0000 mg | ORAL_TABLET | Freq: Four times a day (QID) | ORAL | Status: DC | PRN

## 2016-12-01 MED ORDER — POTASSIUM CHLORIDE IN NACL 20-0.9 MEQ/L-% IV SOLN
INTRAVENOUS | Status: DC
  Administered 2016-12-01: 13:00:00 via INTRAVENOUS

## 2016-12-01 NOTE — Consult Note (Signed)
Reason for Consult: Abdominal pain Referring Physician: Dr. Sinclair Ship is an 51 y.o. female.  HPI: Patient is a 51 year old white female who presented to the emergency room yesterday evening with recurrent abdominal pain that had been present for a few days. She was seen approximately 10 days ago for abdominal pain and was found to have a urinary tract infection. She was started on antibiotic. She was at work 3 days ago and started having nonspecific abdominal cramping. She stated that her bowel movements had decreased. The abdominal pain then spontaneously resolved and then recurred, starting in the epigastric region. CT scan of the abdomen and pelvis done in the emergency room revealed questionable sigmoid diverticulitis and possible distal tip appendiceal swelling and inflammation. She never has had specific right lower quadrant abdominal pain. She currently has received pain medication and states that she does not have significant abdominal pain at the present time. She denies any fever, chills, or jaundice.  Past Medical History:  Diagnosis Date  . Allergy   . Complication of anesthesia    waking up too soon; severe itching and rash  . Depression   . GERD (gastroesophageal reflux disease)   . Plantar fasciitis   . PONV (postoperative nausea and vomiting)     Past Surgical History:  Procedure Laterality Date  . DILITATION & CURRETTAGE/HYSTROSCOPY WITH NOVASURE ABLATION N/A 11/17/2014   Procedure: HYSTEROSCOPY, UTERINE CURETTAGE, ENDOMETRIAL ABLATION USING NOVASURE;  Surgeon: Jonnie Kind, MD;  Location: [facility];  Service: Gynecology;  Laterality: N/A;  Uterine Cavity Length 5.0cm Uterine Cavity Width 4.3cm Power 118 Time 1 minute 18 seconds  . TUBAL LIGATION  12/1988  . WISDOM TOOTH EXTRACTION Bilateral 1996    Family History  Problem Relation Age of Onset  . Hypertension Mother   . Hypertension Father   . ADD / ADHD Sister   . Depression Sister   . Diabetes Daughter    . Diabetes Maternal Grandmother   . Heart disease Maternal Grandmother   . Cancer Maternal Grandfather        throat, prostate  . Hypertension Maternal Grandfather   . Diabetes Paternal Grandmother   . Hypertension Paternal Grandmother   . Arthritis Paternal Grandmother   . ADD / ADHD Sister     Social History:  reports that she has quit smoking. Her smoking use included Cigarettes. She has a 13.50 pack-year smoking history. She has never used smokeless tobacco. She reports that she does not drink alcohol or use drugs.  Allergies: No Known Allergies  Medications:  Prior to Admission:  Prescriptions Prior to Admission  Medication Sig Dispense Refill Last Dose  . cetirizine (ZYRTEC) 10 MG tablet Take 10 mg by mouth daily.   11/30/2016 at Unknown time  . omeprazole (PRILOSEC OTC) 20 MG tablet Take 20 mg by mouth daily.   11/30/2016 at Unknown time  . cephALEXin (KEFLEX) 500 MG capsule Take 1 capsule (500 mg total) by mouth 4 (four) times daily. (Patient not taking: Reported on 11/30/2016) 28 capsule 0 Not Taking at Unknown time  . phenazopyridine (PYRIDIUM) 200 MG tablet Take 1 tablet (200 mg total) by mouth 3 (three) times daily. (Patient not taking: Reported on 11/30/2016) 6 tablet 0 Not Taking at Unknown time  . sulfamethoxazole-trimethoprim (BACTRIM DS,SEPTRA DS) 800-160 MG tablet Take 1 tablet by mouth daily. 3 day course starting on 11/27/16   Completed Course at Unknown time   Scheduled: . heparin  5,000 Units Subcutaneous Q8H  . loratadine  10 mg Oral Daily  . pantoprazole (PROTONIX) IV  40 mg Intravenous Q24H  . sodium chloride flush  3 mL Intravenous Q12H    Results for orders placed or performed during the hospital encounter of 11/30/16 (from the past 48 hour(s))  Urinalysis, Routine w reflex microscopic     Status: Abnormal   Collection Time: 11/30/16  7:28 PM  Result Value Ref Range   Color, Urine YELLOW YELLOW   APPearance CLEAR CLEAR   Specific Gravity, Urine 1.019  1.005 - 1.030   pH 5.0 5.0 - 8.0   Glucose, UA NEGATIVE NEGATIVE mg/dL   Hgb urine dipstick SMALL (A) NEGATIVE   Bilirubin Urine NEGATIVE NEGATIVE   Ketones, ur NEGATIVE NEGATIVE mg/dL   Protein, ur NEGATIVE NEGATIVE mg/dL   Nitrite NEGATIVE NEGATIVE   Leukocytes, UA NEGATIVE NEGATIVE   RBC / HPF 0-5 0 - 5 RBC/hpf   WBC, UA 0-5 0 - 5 WBC/hpf   Bacteria, UA NONE SEEN NONE SEEN   Squamous Epithelial / LPF 6-30 (A) NONE SEEN   Mucous PRESENT   Lipase, blood     Status: None   Collection Time: 11/30/16  8:04 PM  Result Value Ref Range   Lipase 23 11 - 51 U/L  Comprehensive metabolic panel     Status: Abnormal   Collection Time: 11/30/16  8:04 PM  Result Value Ref Range   Sodium 141 135 - 145 mmol/L   Potassium 3.9 3.5 - 5.1 mmol/L   Chloride 105 101 - 111 mmol/L   CO2 27 22 - 32 mmol/L   Glucose, Bld 115 (H) 65 - 99 mg/dL   BUN 11 6 - 20 mg/dL   Creatinine, Ser 0.94 0.44 - 1.00 mg/dL   Calcium 9.1 8.9 - 10.3 mg/dL   Total Protein 6.7 6.5 - 8.1 g/dL   Albumin 3.7 3.5 - 5.0 g/dL   AST 15 15 - 41 U/L   ALT 16 14 - 54 U/L   Alkaline Phosphatase 68 38 - 126 U/L   Total Bilirubin 0.2 (L) 0.3 - 1.2 mg/dL   GFR calc non Af Amer >60 >60 mL/min   GFR calc Af Amer >60 >60 mL/min    Comment: (NOTE) The eGFR has been calculated using the CKD EPI equation. This calculation has not been validated in all clinical situations. eGFR's persistently <60 mL/min signify possible Chronic Kidney Disease.    Anion gap 9 5 - 15  CBC     Status: None   Collection Time: 11/30/16  8:04 PM  Result Value Ref Range   WBC 10.0 4.0 - 10.5 K/uL   RBC 4.36 3.87 - 5.11 MIL/uL   Hemoglobin 13.0 12.0 - 15.0 g/dL   HCT 39.6 36.0 - 46.0 %   MCV 90.8 78.0 - 100.0 fL   MCH 29.8 26.0 - 34.0 pg   MCHC 32.8 30.0 - 36.0 g/dL   RDW 13.6 11.5 - 15.5 %   Platelets 322 150 - 400 K/uL  CBC     Status: None   Collection Time: 12/01/16  5:12 AM  Result Value Ref Range   WBC 8.9 4.0 - 10.5 K/uL   RBC 4.11 3.87 -  5.11 MIL/uL   Hemoglobin 12.5 12.0 - 15.0 g/dL   HCT 37.4 36.0 - 46.0 %   MCV 91.0 78.0 - 100.0 fL   MCH 30.4 26.0 - 34.0 pg   MCHC 33.4 30.0 - 36.0 g/dL   RDW 13.6 11.5 - 15.5 %  Platelets 309 150 - 400 K/uL  Comprehensive metabolic panel     Status: Abnormal   Collection Time: 12/01/16  5:12 AM  Result Value Ref Range   Sodium 135 135 - 145 mmol/L   Potassium 3.8 3.5 - 5.1 mmol/L   Chloride 102 101 - 111 mmol/L   CO2 25 22 - 32 mmol/L   Glucose, Bld 92 65 - 99 mg/dL   BUN 9 6 - 20 mg/dL   Creatinine, Ser 0.89 0.44 - 1.00 mg/dL   Calcium 8.5 (L) 8.9 - 10.3 mg/dL   Total Protein 6.4 (L) 6.5 - 8.1 g/dL   Albumin 3.3 (L) 3.5 - 5.0 g/dL   AST 15 15 - 41 U/L   ALT 14 14 - 54 U/L   Alkaline Phosphatase 56 38 - 126 U/L   Total Bilirubin 0.2 (L) 0.3 - 1.2 mg/dL   GFR calc non Af Amer >60 >60 mL/min   GFR calc Af Amer >60 >60 mL/min    Comment: (NOTE) The eGFR has been calculated using the CKD EPI equation. This calculation has not been validated in all clinical situations. eGFR's persistently <60 mL/min signify possible Chronic Kidney Disease.    Anion gap 8 5 - 15    Dg Chest 2 View  Result Date: 11/30/2016 CLINICAL DATA:  52 year old female with abdominal pain. EXAM: CHEST  2 VIEW COMPARISON:  Chest radiograph dated 03/20/2016 FINDINGS: The heart size and mediastinal contours are within normal limits. Both lungs are clear. The visualized skeletal structures are unremarkable. IMPRESSION: No active cardiopulmonary disease. Electronically Signed   By: Anner Crete M.D.   On: 11/30/2016 22:37   Ct Abdomen Pelvis W Contrast  Result Date: 11/30/2016 CLINICAL DATA:  Recent urinary tract infection. Three days of abdominal and back pain with nausea. Patient complains of initial right upper quadrant abdominal pain that began generalized. Small amount of blood in urine. EXAM: CT ABDOMEN AND PELVIS WITH CONTRAST TECHNIQUE: Multidetector CT imaging of the abdomen and pelvis was performed  using the standard protocol following bolus administration of intravenous contrast. CONTRAST:  123m ISOVUE-300 IOPAMIDOL (ISOVUE-300) INJECTION 61% COMPARISON:  None. FINDINGS: Lower chest: No acute abnormality. Hepatobiliary: No focal liver abnormality is seen. No gallstones, gallbladder wall thickening, or biliary dilatation. Pancreas: Unremarkable. No pancreatic ductal dilatation or surrounding inflammatory changes. Spleen: Normal in size without focal abnormality. Adrenals/Urinary Tract: Adrenal glands appear normal. Kidneys appear normal without mass, stone or hydronephrosis. No perinephric inflammation. No ureteral or bladder calculi identified. Bladder appears normal. Stomach/Bowel: Bowel is normal in caliber. Scattered diverticulosis within the descending and sigmoid colon. Subtle inflammation about the lower descending colon suspicious for acute diverticulitis. Distal appendix is mildly prominent measuring 8 mm diameter, with questionable periappendiceal stranding (axial series 2, image 65 and 66 ; coronal series 5, image 61 and 62) Vascular/Lymphatic: No significant vascular findings are present. No enlarged abdominal or pelvic lymph nodes. Reproductive: Uterus and bilateral adnexa are unremarkable. Other: No free fluid or abscess collection. No free intraperitoneal air. Musculoskeletal: Mild degenerative spurring in the lumbar spine. No acute or suspicious osseous finding. Superficial soft tissues are unremarkable. IMPRESSION: 1. Distal appendix is mildly prominent in size, measuring 8 mm diameter, raising the possibility of an early/mild tip appendicitis. Questionable associated periappendiceal stranding, but not convincing. 2. Also, subtle inflammation about the lower descending colon, with diverticulosis, suspicious for early/mild diverticulitis. 3. Remainder of the abdomen and pelvis CT is unremarkable, as detailed above. At minimum, recommend close clinical follow-up and short-term follow-up  CT as  needed to exclude very early developing appendicitis and/or diverticulitis. Electronically Signed   By: Franki Cabot M.D.   On: 11/30/2016 22:28    ROS:  Pertinent items noted in HPI and remainder of comprehensive ROS otherwise negative.  Blood pressure 107/62, pulse 73, temperature 98.3 F (36.8 C), temperature source Oral, resp. rate 16, height '5\' 6"'  (1.676 m), weight 197 lb 9.6 oz (89.6 kg), SpO2 96 %. Physical Exam: Pleasant well-developed well-nourished white female in no acute distress. Head is normocephalic, atraumatic Neck is supple Lungs clear auscultation with equal breath sounds bilaterally Heart examination reveals a regular rate and rhythm without S3, S4, murmurs Abdomen is soft and nondistended. No specific tenderness is noted to palpation. She does not have McBurney's point tenderness. She does have some discomfort to deep palpation in the left lower quadrant. No rigidity is noted. CT scan report and images reviewed.  Assessment/Plan: Impression: Abdominal pain of unknown etiology. Her symptom complex seems more in line with diverticulosis/diverticulitis that it does with appendicitis. Her recent urinalysis appears negative. No need for acute surgical intervention at this time. Plan: We'll see how her abdominal exam progresses over the next 24 hours. Would continue IV antibiotics as scheduled. We will follow with you.  Aviva Signs 12/01/2016, 10:49 AM

## 2016-12-01 NOTE — Progress Notes (Signed)
PROGRESS NOTE    Lorraine Kelly  MMH:[id number] DOB: [birth year] DOA: 11/30/2016 PCP: Sharilyn Sites, MD    Brief Narrative:  Patient is an 51 y.o. female with hx of GERD and depression, who presented to the ED on 11/30/16 with persistent abdominal pain for a few days. The pain started about a week ago, it had gotten better, but then the pain returned.  She had nausea, but still had some appetite. She denied vomiting.  No black or bloody stools. No diarrhea.  She had some subjective fever, but no chills. She was treated a couple weeks ago with Septra for an UTI.  Evalution in the ED revealed no fever and her WBC was within normal limits, but her abdominal/pelvic CT revealed stranding suggestive of early appendicitis, and also queried early diverticulitis. Her UA was essentially negative. Lipase and LFTs were within normal limits.  EDP consulted Dr Arnoldo Morale, and hospitalist was asked to admit her for the abdominal pelvic CT findings.    Assessment & Plan:   Principal Problem:   Abdominal pain Active Problems:   Acute diverticulitis   GERD (gastroesophageal reflux disease)   Obesity   1. Abdominal pain, consistent with diverticulitis. Patient was made to be nothing by mouth for bowel rest. IV fluids and analgesics, antiemetics were started as needed. -Cipro and Flagyl were started in the ED. They will be continued. -General surgeon Dr. Arnoldo Morale was consulted. Per his assessment, her symptom complex seemed to be more in line with diverticulosis/diverticulitis than that of appendicitis. He went on to say that there was no need for acute surgical intervention at this time. Otherwise he agreed with medical management and antibiotics. -Diet advanced to clear liquids by Dr. Arnoldo Morale. -Discontinue dextrose in the IV fluids.  GERD. Protonix was restarted; ordered IV until her intake improves.   DVT prophylaxis: Subcutaneous heparin Code Status: Full code Family Communication: Discussed with  daughter Disposition Plan: Discharge when clinically appropriate, likely in a few days.   Consultants:   Gen. surgery  Procedures:   None  Antimicrobials:   Cipro 6/23>>  Flagyl 6/323>>    Subjective: Patient denies right lower quadrant pain, but does have some discomfort on the lower left abdomen. She has some nausea but no vomiting. No recent bowel movement; no diarrhea.  Objective: Vitals:   12/01/16 0000 12/01/16 0030 12/01/16 0112 12/01/16 0609  BP: 104/76 104/74 (!) 101/55 107/62  Pulse: 84 81 79 73  Resp: 18  18 16   Temp:   99.2 F (37.3 C) 98.3 F (36.8 C)  TempSrc:   Oral Oral  SpO2: 96% 97% 99% 96%  Weight:   89.6 kg (197 lb 9.6 oz)   Height:   5\' 6"  (1.676 m)     Intake/Output Summary (Last 24 hours) at 12/01/16 1217 Last data filed at 12/01/16 0500  Gross per 24 hour  Intake           511.67 ml  Output                0 ml  Net           511.67 ml   Filed Weights   11/30/16 1926 12/01/16 0112  Weight: 88.5 kg (195 lb) 89.6 kg (197 lb 9.6 oz)    Examination:  General exam: Appears calm and comfortable  Respiratory system: Clear to auscultation. Respiratory effort normal. Cardiovascular system: S1 & S2 heard, RRR. No JVD, murmurs, rubs, gallops or clicks. No pedal edema. Gastrointestinal system: Abdomen is  obese, nondistended, soft, mildly-moderately tender left lower quadrant; no distention or rebound. No organomegaly or masses felt. Normal bowel sounds heard. Central nervous system: Alert and oriented. No focal neurological deficits. Extremities: Symmetric 5 x 5 power. Skin: No rashes, lesions or ulcers Psychiatry: Judgement and insight appear normal. Mood & affect appropriate.     Data Reviewed: I have personally reviewed following labs and imaging studies  CBC:  Recent Labs Lab 11/30/16 2004 12/01/16 0512  WBC 10.0 8.9  HGB 13.0 12.5  HCT 39.6 37.4  MCV 90.8 91.0  PLT 322 341   Basic Metabolic Panel:  Recent Labs Lab  11/30/16 2004 12/01/16 0512  NA 141 135  K 3.9 3.8  CL 105 102  CO2 27 25  GLUCOSE 115* 92  BUN 11 9  CREATININE 0.94 0.89  CALCIUM 9.1 8.5*   GFR: Estimated Creatinine Clearance: 86.2 mL/min (by C-G formula based on SCr of 0.89 mg/dL). Liver Function Tests:  Recent Labs Lab 11/30/16 2004 12/01/16 0512  AST 15 15  ALT 16 14  ALKPHOS 68 56  BILITOT 0.2* 0.2*  PROT 6.7 6.4*  ALBUMIN 3.7 3.3*    Recent Labs Lab 11/30/16 2004  LIPASE 23   No results for input(s): AMMONIA in the last 168 hours. Coagulation Profile: No results for input(s): INR, PROTIME in the last 168 hours. Cardiac Enzymes: No results for input(s): CKTOTAL, CKMB, CKMBINDEX, TROPONINI in the last 168 hours. BNP (last 3 results) No results for input(s): PROBNP in the last 8760 hours. HbA1C: No results for input(s): HGBA1C in the last 72 hours. CBG: No results for input(s): GLUCAP in the last 168 hours. Lipid Profile: No results for input(s): CHOL, HDL, LDLCALC, TRIG, CHOLHDL, LDLDIRECT in the last 72 hours. Thyroid Function Tests: No results for input(s): TSH, T4TOTAL, FREET4, T3FREE, THYROIDAB in the last 72 hours. Anemia Panel: No results for input(s): VITAMINB12, FOLATE, FERRITIN, TIBC, IRON, RETICCTPCT in the last 72 hours. Sepsis Labs: No results for input(s): PROCALCITON, LATICACIDVEN in the last 168 hours.  No results found for this or any previous visit (from the past 240 hour(s)).       Radiology Studies: Dg Chest 2 View  Result Date: 11/30/2016 CLINICAL DATA:  51 year old female with abdominal pain. EXAM: CHEST  2 VIEW COMPARISON:  Chest radiograph dated 03/20/2016 FINDINGS: The heart size and mediastinal contours are within normal limits. Both lungs are clear. The visualized skeletal structures are unremarkable. IMPRESSION: No active cardiopulmonary disease. Electronically Signed   By: Anner Crete M.D.   On: 11/30/2016 22:37   Ct Abdomen Pelvis W Contrast  Result Date:  11/30/2016 CLINICAL DATA:  Recent urinary tract infection. Three days of abdominal and back pain with nausea. Patient complains of initial right upper quadrant abdominal pain that began generalized. Small amount of blood in urine. EXAM: CT ABDOMEN AND PELVIS WITH CONTRAST TECHNIQUE: Multidetector CT imaging of the abdomen and pelvis was performed using the standard protocol following bolus administration of intravenous contrast. CONTRAST:  189mL ISOVUE-300 IOPAMIDOL (ISOVUE-300) INJECTION 61% COMPARISON:  None. FINDINGS: Lower chest: No acute abnormality. Hepatobiliary: No focal liver abnormality is seen. No gallstones, gallbladder wall thickening, or biliary dilatation. Pancreas: Unremarkable. No pancreatic ductal dilatation or surrounding inflammatory changes. Spleen: Normal in size without focal abnormality. Adrenals/Urinary Tract: Adrenal glands appear normal. Kidneys appear normal without mass, stone or hydronephrosis. No perinephric inflammation. No ureteral or bladder calculi identified. Bladder appears normal. Stomach/Bowel: Bowel is normal in caliber. Scattered diverticulosis within the descending and sigmoid colon.  Subtle inflammation about the lower descending colon suspicious for acute diverticulitis. Distal appendix is mildly prominent measuring 8 mm diameter, with questionable periappendiceal stranding (axial series 2, image 65 and 66 ; coronal series 5, image 61 and 62) Vascular/Lymphatic: No significant vascular findings are present. No enlarged abdominal or pelvic lymph nodes. Reproductive: Uterus and bilateral adnexa are unremarkable. Other: No free fluid or abscess collection. No free intraperitoneal air. Musculoskeletal: Mild degenerative spurring in the lumbar spine. No acute or suspicious osseous finding. Superficial soft tissues are unremarkable. IMPRESSION: 1. Distal appendix is mildly prominent in size, measuring 8 mm diameter, raising the possibility of an early/mild tip appendicitis.  Questionable associated periappendiceal stranding, but not convincing. 2. Also, subtle inflammation about the lower descending colon, with diverticulosis, suspicious for early/mild diverticulitis. 3. Remainder of the abdomen and pelvis CT is unremarkable, as detailed above. At minimum, recommend close clinical follow-up and short-term follow-up CT as needed to exclude very early developing appendicitis and/or diverticulitis. Electronically Signed   By: Franki Cabot M.D.   On: 11/30/2016 22:28        Scheduled Meds: . heparin  5,000 Units Subcutaneous Q8H  . loratadine  10 mg Oral Daily  . pantoprazole (PROTONIX) IV  40 mg Intravenous Q24H  . sodium chloride flush  3 mL Intravenous Q12H   Continuous Infusions: . ciprofloxacin Stopped (12/01/16 0254)  . dextrose 5 % and 0.9% NaCl 100 mL/hr at 12/01/16 0153  . metronidazole Stopped (12/01/16 1029)     LOS: 1 day    Time spent: 28 minutes    Rexene Alberts, MD [facility] Pager [phone]  If 7PM-7AM, please contact night-coverage [email or web address] Password [facility] 12/01/2016, 12:17 PM

## 2016-12-02 DIAGNOSIS — R109 Unspecified abdominal pain: Secondary | ICD-10-CM

## 2016-12-02 DIAGNOSIS — K219 Gastro-esophageal reflux disease without esophagitis: Secondary | ICD-10-CM

## 2016-12-02 DIAGNOSIS — R1032 Left lower quadrant pain: Secondary | ICD-10-CM

## 2016-12-02 DIAGNOSIS — K5792 Diverticulitis of intestine, part unspecified, without perforation or abscess without bleeding: Secondary | ICD-10-CM

## 2016-12-02 LAB — HIV ANTIBODY (ROUTINE TESTING W REFLEX): HIV SCREEN 4TH GENERATION: NONREACTIVE

## 2016-12-02 NOTE — Progress Notes (Addendum)
PROGRESS NOTE  Lorraine Kelly WIO:[id number] DOB: [birth year] DOA: 11/30/2016 PCP: Sharilyn Sites, MD  Brief History:  51 y.o.femalewith hx of GERD and depression, who presented to the ED on 11/30/16 with persistent abdominal pain for a 2-3 days. The pain started one week prior to admission, it had gotten better, but then the pain returned. She had nausea, but still had some appetite. She denied vomiting. No black or bloody stools. No diarrhea. She had some subjective fever, but no chills. She was treated a couple weeks ago with cipro, then switch Septra for an UTI.  She finished the septra on 11/29/16.  Evalution in the ED revealed no fever and her WBC was within normal limits, but her abdominal/pelvic CT revealed stranding suggestive of early appendicitis, and also queried early diverticulitis. Her UA was essentially negative. Lipase and LFTs were within normal limits. EDP consulted Dr Arnoldo Morale, and hospitalist was asked to admit her for the abdominal pelvic CT findings.   Assessment/Plan: Abdominal pain, consistent with diverticulitis. -Patient was made to be nothing by mouth for bowel rest. IV fluids and analgesics, antiemetics were started as needed. -Continue Cipro and Flagyl  -General surgeon Dr. Arnoldo Morale was consulted. Per his assessment, her symptom complex seemed to be more in line with diverticulosis/diverticulitis than that of appendicitis---no need for acute surgical intervention at this time. Otherwise he agreed with medical management and antibiotics. -Diet advanced to full liquids by Dr. Arnoldo Morale. -Continue IVF -AM BMP, CBC -still requiring intermittent IV fentanyl  GERD. Protonix was restarted; ordered IV until her intake improves.       Disposition Plan:   Home 6/26 if cleared by surgery Family Communication:   Daughter updated Family at bedside  Consultants:  General surgery  Code Status:  FULL  DVT Prophylaxis:  [place] Heparin    Procedures: As Listed in  Progress Note Above  Antibiotics: cipro 6/24>>> Flagyl 6/24>>>    Subjective: Patient says that her abdominal pain is better than the time of admission. She feels hungry. Denies any fevers, chills, chest pain, shortness breath, nausea, vomiting, diarrhea, dysuria, hematuria. No rashes.  Objective: Vitals:   12/01/16 1442 12/01/16 2000 12/02/16 0643 12/02/16 1332  BP: (!) 98/50 (!) 96/54 99/60   Pulse: 72 77 71 80  Resp: 16 18 18 16   Temp: 98.2 F (36.8 C) 97.6 F (36.4 C) 97.5 F (36.4 C) 97.1 F (36.2 C)  TempSrc: Oral Oral Oral   SpO2: 98% 96% 97% 100%  Weight:      Height:        Intake/Output Summary (Last 24 hours) at 12/02/16 1359 Last data filed at 12/02/16 1307  Gross per 24 hour  Intake          2869.83 ml  Output                0 ml  Net          2869.83 ml   Weight change:  Exam:   General:  Pt is alert, follows commands appropriately, not in acute distress  HEENT: No icterus, No thrush, No neck mass, [place]/AT  Cardiovascular: RRR, S1/S2, no rubs, no gallops  Respiratory: CTA bilaterally, no wheezing, no crackles, no rhonchi  Abdomen: Soft/+BS, non tender, non distended, no guarding  Extremities: No edema, No lymphangitis, No petechiae, No rashes, no synovitis   Data Reviewed: I have personally reviewed following labs and imaging studies Basic Metabolic Panel:  Recent Labs Lab  11/30/16 2004 12/01/16 0512  NA 141 135  K 3.9 3.8  CL 105 102  CO2 27 25  GLUCOSE 115* 92  BUN 11 9  CREATININE 0.94 0.89  CALCIUM 9.1 8.5*   Liver Function Tests:  Recent Labs Lab 11/30/16 2004 12/01/16 0512  AST 15 15  ALT 16 14  ALKPHOS 68 56  BILITOT 0.2* 0.2*  PROT 6.7 6.4*  ALBUMIN 3.7 3.3*    Recent Labs Lab 11/30/16 2004  LIPASE 23   No results for input(s): AMMONIA in the last 168 hours. Coagulation Profile: No results for input(s): INR, PROTIME in the last 168 hours. CBC:  Recent Labs Lab 11/30/16 2004 12/01/16 0512  WBC 10.0 8.9   HGB 13.0 12.5  HCT 39.6 37.4  MCV 90.8 91.0  PLT 322 309   Cardiac Enzymes: No results for input(s): CKTOTAL, CKMB, CKMBINDEX, TROPONINI in the last 168 hours. BNP: Invalid input(s): POCBNP CBG: No results for input(s): GLUCAP in the last 168 hours. HbA1C: No results for input(s): HGBA1C in the last 72 hours. Urine analysis:    Component Value Date/Time   COLORURINE YELLOW 11/30/2016 1928   APPEARANCEUR CLEAR 11/30/2016 1928   LABSPEC 1.019 11/30/2016 1928   PHURINE 5.0 11/30/2016 1928   GLUCOSEU NEGATIVE 11/30/2016 1928   HGBUR SMALL (A) 11/30/2016 1928   BILIRUBINUR NEGATIVE 11/30/2016 1928   KETONESUR NEGATIVE 11/30/2016 1928   PROTEINUR NEGATIVE 11/30/2016 1928   UROBILINOGEN 0.2 11/14/2014 1030   NITRITE NEGATIVE 11/30/2016 1928   LEUKOCYTESUR NEGATIVE 11/30/2016 1928   Sepsis Labs: @LABRCNTIP (procalcitonin:4,lacticidven:4) )No results found for this or any previous visit (from the past 240 hour(s)).   Scheduled Meds: . heparin  5,000 Units Subcutaneous Q8H  . loratadine  10 mg Oral Daily  . pantoprazole (PROTONIX) IV  40 mg Intravenous Q24H  . sodium chloride flush  3 mL Intravenous Q12H   Continuous Infusions: . 0.9 % NaCl with KCl 20 mEq / L Stopped (12/02/16 1303)  . ciprofloxacin 400 mg (12/02/16 1303)  . metronidazole Stopped (12/02/16 1018)    Procedures/Studies: Dg Chest 2 View  Result Date: 11/30/2016 CLINICAL DATA:  51 year old female with abdominal pain. EXAM: CHEST  2 VIEW COMPARISON:  Chest radiograph dated 03/20/2016 FINDINGS: The heart size and mediastinal contours are within normal limits. Both lungs are clear. The visualized skeletal structures are unremarkable. IMPRESSION: No active cardiopulmonary disease. Electronically Signed   By: Anner Crete M.D.   On: 11/30/2016 22:37   Ct Abdomen Pelvis W Contrast  Result Date: 11/30/2016 CLINICAL DATA:  Recent urinary tract infection. Three days of abdominal and back pain with nausea. Patient  complains of initial right upper quadrant abdominal pain that began generalized. Small amount of blood in urine. EXAM: CT ABDOMEN AND PELVIS WITH CONTRAST TECHNIQUE: Multidetector CT imaging of the abdomen and pelvis was performed using the standard protocol following bolus administration of intravenous contrast. CONTRAST:  19mL ISOVUE-300 IOPAMIDOL (ISOVUE-300) INJECTION 61% COMPARISON:  None. FINDINGS: Lower chest: No acute abnormality. Hepatobiliary: No focal liver abnormality is seen. No gallstones, gallbladder wall thickening, or biliary dilatation. Pancreas: Unremarkable. No pancreatic ductal dilatation or surrounding inflammatory changes. Spleen: Normal in size without focal abnormality. Adrenals/Urinary Tract: Adrenal glands appear normal. Kidneys appear normal without mass, stone or hydronephrosis. No perinephric inflammation. No ureteral or bladder calculi identified. Bladder appears normal. Stomach/Bowel: Bowel is normal in caliber. Scattered diverticulosis within the descending and sigmoid colon. Subtle inflammation about the lower descending colon suspicious for acute diverticulitis. Distal appendix is mildly  prominent measuring 8 mm diameter, with questionable periappendiceal stranding (axial series 2, image 65 and 66 ; coronal series 5, image 61 and 62) Vascular/Lymphatic: No significant vascular findings are present. No enlarged abdominal or pelvic lymph nodes. Reproductive: Uterus and bilateral adnexa are unremarkable. Other: No free fluid or abscess collection. No free intraperitoneal air. Musculoskeletal: Mild degenerative spurring in the lumbar spine. No acute or suspicious osseous finding. Superficial soft tissues are unremarkable. IMPRESSION: 1. Distal appendix is mildly prominent in size, measuring 8 mm diameter, raising the possibility of an early/mild tip appendicitis. Questionable associated periappendiceal stranding, but not convincing. 2. Also, subtle inflammation about the lower  descending colon, with diverticulosis, suspicious for early/mild diverticulitis. 3. Remainder of the abdomen and pelvis CT is unremarkable, as detailed above. At minimum, recommend close clinical follow-up and short-term follow-up CT as needed to exclude very early developing appendicitis and/or diverticulitis. Electronically Signed   By: Franki Cabot M.D.   On: 11/30/2016 22:28    Matilda Fleig, DO  [facility] Pager [phone]  If 7PM-7AM, please contact night-coverage [email or web address] Password [email or web address] 12/02/2016, 1:59 PM   LOS: 2 days

## 2016-12-02 NOTE — Progress Notes (Signed)
Subjective: Patient states that she is a little better. Her abdominal pain is not significant and seems to be localized to the left lower quadrant and suprapubic region.  Objective: Vital signs in last 24 hours: Temp:  [97.5 F (36.4 C)-98.2 F (36.8 C)] 97.5 F (36.4 C) (06/25 0643) Pulse Rate:  [71-77] 71 (06/25 0643) Resp:  [16-18] 18 (06/25 0643) BP: (96-99)/(50-60) 99/60 (06/25 0643) SpO2:  [96 %-98 %] 97 % (06/25 0643) Last BM Date: 11/28/16  Intake/Output from previous day: 06/24 0701 - 06/25 0700 In: 955 [P.O.:480; I.V.:175; IV Piggyback:300] Out: -  Intake/Output this shift: No intake/output data recorded.  General appearance: alert, cooperative and no distress GI: Soft with discomfort to palpation in the suprapubic and left lower quadrant regions. No right lower quadrant point tenderness noted. No rigidity noted. Bowel sounds present.  Lab Results:   Recent Labs  11/30/16 2004 12/01/16 0512  WBC 10.0 8.9  HGB 13.0 12.5  HCT 39.6 37.4  PLT 322 309   BMET  Recent Labs  11/30/16 2004 12/01/16 0512  NA 141 135  K 3.9 3.8  CL 105 102  CO2 27 25  GLUCOSE 115* 92  BUN 11 9  CREATININE 0.94 0.89  CALCIUM 9.1 8.5*   PT/INR No results for input(s): LABPROT, INR in the last 72 hours.  Studies/Results: Dg Chest 2 View  Result Date: 11/30/2016 CLINICAL DATA:  51 year old female with abdominal pain. EXAM: CHEST  2 VIEW COMPARISON:  Chest radiograph dated 03/20/2016 FINDINGS: The heart size and mediastinal contours are within normal limits. Both lungs are clear. The visualized skeletal structures are unremarkable. IMPRESSION: No active cardiopulmonary disease. Electronically Signed   By: Anner Crete M.D.   On: 11/30/2016 22:37   Ct Abdomen Pelvis W Contrast  Result Date: 11/30/2016 CLINICAL DATA:  Recent urinary tract infection. Three days of abdominal and back pain with nausea. Patient complains of initial right upper quadrant abdominal pain that  began generalized. Small amount of blood in urine. EXAM: CT ABDOMEN AND PELVIS WITH CONTRAST TECHNIQUE: Multidetector CT imaging of the abdomen and pelvis was performed using the standard protocol following bolus administration of intravenous contrast. CONTRAST:  173mL ISOVUE-300 IOPAMIDOL (ISOVUE-300) INJECTION 61% COMPARISON:  None. FINDINGS: Lower chest: No acute abnormality. Hepatobiliary: No focal liver abnormality is seen. No gallstones, gallbladder wall thickening, or biliary dilatation. Pancreas: Unremarkable. No pancreatic ductal dilatation or surrounding inflammatory changes. Spleen: Normal in size without focal abnormality. Adrenals/Urinary Tract: Adrenal glands appear normal. Kidneys appear normal without mass, stone or hydronephrosis. No perinephric inflammation. No ureteral or bladder calculi identified. Bladder appears normal. Stomach/Bowel: Bowel is normal in caliber. Scattered diverticulosis within the descending and sigmoid colon. Subtle inflammation about the lower descending colon suspicious for acute diverticulitis. Distal appendix is mildly prominent measuring 8 mm diameter, with questionable periappendiceal stranding (axial series 2, image 65 and 66 ; coronal series 5, image 61 and 62) Vascular/Lymphatic: No significant vascular findings are present. No enlarged abdominal or pelvic lymph nodes. Reproductive: Uterus and bilateral adnexa are unremarkable. Other: No free fluid or abscess collection. No free intraperitoneal air. Musculoskeletal: Mild degenerative spurring in the lumbar spine. No acute or suspicious osseous finding. Superficial soft tissues are unremarkable. IMPRESSION: 1. Distal appendix is mildly prominent in size, measuring 8 mm diameter, raising the possibility of an early/mild tip appendicitis. Questionable associated periappendiceal stranding, but not convincing. 2. Also, subtle inflammation about the lower descending colon, with diverticulosis, suspicious for early/mild  diverticulitis. 3. Remainder of the abdomen and  pelvis CT is unremarkable, as detailed above. At minimum, recommend close clinical follow-up and short-term follow-up CT as needed to exclude very early developing appendicitis and/or diverticulitis. Electronically Signed   By: Franki Cabot M.D.   On: 11/30/2016 22:28    Anti-infectives: Anti-infectives    Start     Dose/Rate Route Frequency Ordered Stop   12/01/16 0130  ciprofloxacin (CIPRO) IVPB 400 mg     400 mg 200 mL/hr over 60 Minutes Intravenous Every 12 hours 12/01/16 0110     12/01/16 0130  metroNIDAZOLE (FLAGYL) IVPB 500 mg     500 mg 100 mL/hr over 60 Minutes Intravenous Every 8 hours 12/01/16 0110 12/11/16 0129      Assessment/Plan: Impression: Abdominal pain most likely secondary to diverticulitis. Patient clinically does not have acute appendicitis. No need for acute surgical intervention. Will advance diet to full liquids. Would treat the diverticulitis.  LOS: 2 days    Aviva Signs 12/02/2016

## 2016-12-02 NOTE — Discharge Summary (Signed)
Physician Discharge Summary  SAMUELLA RASOOL QQI:[id number] DOB: 01/14/1966 DOA: 11/30/2016  PCP: Sharilyn Sites, MD  Admit date: 11/30/2016 Discharge date: 12/03/2016  Admitted From: Home Disposition:  Home  Recommendations for Outpatient Follow-up:  1. Follow up with PCP in 1-2 weeks 2. Please obtain BMP/CBC in one week   Discharge Condition: Stable CODE STATUS:FULL Diet recommendation: Regular   Brief/Interim Summary: 51 y.o.femalewith hx of GERDanddepression,whop[facility]ed to the [facility]18with persistent abdominal pain for a 2-3 days. Thepainstartedone week prior to admission,it hadgotten better, but then the pain returned.She hadnausea, but still hadsome appetite.She denied vomiting.No black or bloody stools. No diarrhea. She hadsome subjective fever,butno chills. She was treated a couple weeks ago with cipro, then switch Septra for anUTI. She finished the septra on 11/29/16.  Evalution in the [facility] no fever and her WBC was within normal limits, but her abdominal/pelvic CTrevealedstranding suggestive of early appendicitis, and also queried early diverticulitis.Her UA was essentially negative. Lipase and LFTs were within normal limits. EDP consulted Dr Arnoldo Morale, and hospitalist was asked to admit her for the abdominal pelvic CT findings.   Discharge Diagnoses:  Abdominal pain, consistent with diverticulitis. -Patient was made to be nothing by mouth for bowel rest. IV fluids and analgesics, antiemetics were started as needed. -Continue Cipro and Flagyl--plan to d/c home with 8 more days of abx to complete 10 day course -General surgeon Dr. Arnoldo Morale was consulted. Per his assessment, her symptom complex seemed to be more in line with diverticulosis/diverticulitis than that of appendicitis---no need for acute surgical intervention at this time. Otherwise he agreed with medical management and antibiotics. -Diet advancedto soft diet which patient  tolerated -Continue IVF  GERD. Protonix was restarted; ordered IV until her intake improves. -resume omeprazole after discharge   Discharge Instructions  Discharge Instructions    Diet - low sodium heart healthy    Complete by:  As directed    Increase activity slowly    Complete by:  As directed      Allergies as of 12/03/2016   No Known Allergies     Medication List    STOP taking these medications   cephALEXin 500 MG capsule Commonly known as:  KEFLEX   phenazopyridine 200 MG tablet Commonly known as:  PYRIDIUM   sulfamethoxazole-trimethoprim 800-160 MG tablet Commonly known as:  BACTRIM DS,SEPTRA DS     TAKE these medications   cetirizine 10 MG tablet Commonly known as:  ZYRTEC Take 10 mg by mouth daily.   ciprofloxacin 500 MG tablet Commonly known as:  CIPRO Take 1 tablet (500 mg total) by mouth 2 (two) times daily.   metroNIDAZOLE 500 MG tablet Commonly known as:  FLAGYL Take 1 tablet (500 mg total) by mouth every 8 (eight) hours.   omeprazole 20 MG tablet Commonly known as:  PRILOSEC OTC Take 20 mg by mouth daily.       No Known Allergies  Consultations:  General surgery--Mark Arnoldo Morale   Procedures/Studies: Dg Chest 2 View  Result Date: 11/30/2016 CLINICAL DATA:  51 year old female with abdominal pain. EXAM: CHEST  2 VIEW COMPARISON:  Chest radiograph dated 03/20/2016 FINDINGS: The heart size and mediastinal contours are within normal limits. Both lungs are clear. The visualized skeletal structures are unremarkable. IMPRESSION: No active cardiopulmonary disease. Electronically Signed   By: Anner Crete M.D.   On: 11/30/2016 22:37   Ct Abdomen Pelvis W Contrast  Result Date: 11/30/2016 CLINICAL DATA:  Recent urinary tract infection. Three days of abdominal and back pain with nausea.  Patient complains of initial right upper quadrant abdominal pain that began generalized. Small amount of blood in urine. EXAM: CT ABDOMEN AND PELVIS WITH  CONTRAST TECHNIQUE: Multidetector CT imaging of the abdomen and pelvis was performed using the standard protocol following bolus administration of intravenous contrast. CONTRAST:  165mL ISOVUE-300 IOPAMIDOL (ISOVUE-300) INJECTION 61% COMPARISON:  None. FINDINGS: Lower chest: No acute abnormality. Hepatobiliary: No focal liver abnormality is seen. No gallstones, gallbladder wall thickening, or biliary dilatation. Pancreas: Unremarkable. No pancreatic ductal dilatation or surrounding inflammatory changes. Spleen: Normal in size without focal abnormality. Adrenals/Urinary Tract: Adrenal glands appear normal. Kidneys appear normal without mass, stone or hydronephrosis. No perinephric inflammation. No ureteral or bladder calculi identified. Bladder appears normal. Stomach/Bowel: Bowel is normal in caliber. Scattered diverticulosis within the descending and sigmoid colon. Subtle inflammation about the lower descending colon suspicious for acute diverticulitis. Distal appendix is mildly prominent measuring 8 mm diameter, with questionable periappendiceal stranding (axial series 2, image 65 and 66 ; coronal series 5, image 61 and 62) Vascular/Lymphatic: No significant vascular findings are present. No enlarged abdominal or pelvic lymph nodes. Reproductive: Uterus and bilateral adnexa are unremarkable. Other: No free fluid or abscess collection. No free intraperitoneal air. Musculoskeletal: Mild degenerative spurring in the lumbar spine. No acute or suspicious osseous finding. Superficial soft tissues are unremarkable. IMPRESSION: 1. Distal appendix is mildly prominent in size, measuring 8 mm diameter, raising the possibility of an early/mild tip appendicitis. Questionable associated periappendiceal stranding, but not convincing. 2. Also, subtle inflammation about the lower descending colon, with diverticulosis, suspicious for early/mild diverticulitis. 3. Remainder of the abdomen and pelvis CT is unremarkable, as detailed  above. At minimum, recommend close clinical follow-up and short-term follow-up CT as needed to exclude very early developing appendicitis and/or diverticulitis. Electronically Signed   By: Franki Cabot M.D.   On: 11/30/2016 22:28        Discharge Exam: Vitals:   12/02/16 2039 12/03/16 0458  BP: 101/64 (!) 92/48  Pulse: 78 71  Resp: 18 18  Temp: 98.3 F (36.8 C) 98.1 F (36.7 C)   Vitals:   12/02/16 1332 12/02/16 1415 12/02/16 2039 12/03/16 0458  BP:  (!) 94/58 101/64 (!) 92/48  Pulse: 80  78 71  Resp: 16  18 18   Temp: 97.1 F (36.2 C)  98.3 F (36.8 C) 98.1 F (36.7 C)  TempSrc:   Oral Oral  SpO2: 100%  98% 98%  Weight:      Height:        General: Pt is alert, awake, not in acute distress Cardiovascular: RRR, S1/S2 +, no rubs, no gallops Respiratory: CTA bilaterally, no wheezing, no rhonchi Abdominal: Soft, NT, ND, bowel sounds + Extremities: no edema, no cyanosis   The results of significant diagnostics from this hospitalization (including imaging, microbiology, ancillary and laboratory) are listed below for reference.    Significant Diagnostic Studies: Dg Chest 2 View  Result Date: 11/30/2016 CLINICAL DATA:  51 year old female with abdominal pain. EXAM: CHEST  2 VIEW COMPARISON:  Chest radiograph dated 03/20/2016 FINDINGS: The heart size and mediastinal contours are within normal limits. Both lungs are clear. The visualized skeletal structures are unremarkable. IMPRESSION: No active cardiopulmonary disease. Electronically Signed   By: Anner Crete M.D.   On: 11/30/2016 22:37   Ct Abdomen Pelvis W Contrast  Result Date: 11/30/2016 CLINICAL DATA:  Recent urinary tract infection. Three days of abdominal and back pain with nausea. Patient complains of initial right upper quadrant abdominal pain that began generalized.  Small amount of blood in urine. EXAM: CT ABDOMEN AND PELVIS WITH CONTRAST TECHNIQUE: Multidetector CT imaging of the abdomen and pelvis was performed  using the standard protocol following bolus administration of intravenous contrast. CONTRAST:  150mL ISOVUE-300 IOPAMIDOL (ISOVUE-300) INJECTION 61% COMPARISON:  None. FINDINGS: Lower chest: No acute abnormality. Hepatobiliary: No focal liver abnormality is seen. No gallstones, gallbladder wall thickening, or biliary dilatation. Pancreas: Unremarkable. No pancreatic ductal dilatation or surrounding inflammatory changes. Spleen: Normal in size without focal abnormality. Adrenals/Urinary Tract: Adrenal glands appear normal. Kidneys appear normal without mass, stone or hydronephrosis. No perinephric inflammation. No ureteral or bladder calculi identified. Bladder appears normal. Stomach/Bowel: Bowel is normal in caliber. Scattered diverticulosis within the descending and sigmoid colon. Subtle inflammation about the lower descending colon suspicious for acute diverticulitis. Distal appendix is mildly prominent measuring 8 mm diameter, with questionable periappendiceal stranding (axial series 2, image 65 and 66 ; coronal series 5, image 61 and 62) Vascular/Lymphatic: No significant vascular findings are present. No enlarged abdominal or pelvic lymph nodes. Reproductive: Uterus and bilateral adnexa are unremarkable. Other: No free fluid or abscess collection. No free intraperitoneal air. Musculoskeletal: Mild degenerative spurring in the lumbar spine. No acute or suspicious osseous finding. Superficial soft tissues are unremarkable. IMPRESSION: 1. Distal appendix is mildly prominent in size, measuring 8 mm diameter, raising the possibility of an early/mild tip appendicitis. Questionable associated periappendiceal stranding, but not convincing. 2. Also, subtle inflammation about the lower descending colon, with diverticulosis, suspicious for early/mild diverticulitis. 3. Remainder of the abdomen and pelvis CT is unremarkable, as detailed above. At minimum, recommend close clinical follow-up and short-term follow-up CT as  needed to exclude very early developing appendicitis and/or diverticulitis. Electronically Signed   By: Franki Cabot M.D.   On: 11/30/2016 22:28     Microbiology: No results found for this or any previous visit (from the past 240 hour(s)).   Labs: Basic Metabolic Panel:  Recent Labs Lab 11/30/16 2004 12/01/16 0512 12/03/16 0613  NA 141 135 135  K 3.9 3.8 4.1  CL 105 102 105  CO2 27 25 24   GLUCOSE 115* 92 96  BUN 11 9 8   CREATININE 0.94 0.89 0.91  CALCIUM 9.1 8.5* 8.4*   Liver Function Tests:  Recent Labs Lab 11/30/16 2004 12/01/16 0512  AST 15 15  ALT 16 14  ALKPHOS 68 56  BILITOT 0.2* 0.2*  PROT 6.7 6.4*  ALBUMIN 3.7 3.3*    Recent Labs Lab 11/30/16 2004  LIPASE 23   No results for input(s): AMMONIA in the last 168 hours. CBC:  Recent Labs Lab 11/30/16 2004 12/01/16 0512 12/03/16 0613  WBC 10.0 8.9 7.4  HGB 13.0 12.5 12.9  HCT 39.6 37.4 38.7  MCV 90.8 91.0 90.6  PLT 322 309 281   Cardiac Enzymes: No results for input(s): CKTOTAL, CKMB, CKMBINDEX, TROPONINI in the last 168 hours. BNP: Invalid input(s): POCBNP CBG: No results for input(s): GLUCAP in the last 168 hours.  Time coordinating discharge:  Greater than 30 minutes  Signed:  Shequila Neglia, DO [facility] Pager: [phone] 12/03/2016, 10:27 AM

## 2016-12-03 DIAGNOSIS — R1030 Lower abdominal pain, unspecified: Secondary | ICD-10-CM

## 2016-12-03 LAB — BASIC METABOLIC PANEL
Anion gap: 6 (ref 5–15)
BUN: 8 mg/dL (ref 6–20)
CALCIUM: 8.4 mg/dL — AB (ref 8.9–10.3)
CHLORIDE: 105 mmol/L (ref 101–111)
CO2: 24 mmol/L (ref 22–32)
Creatinine, Ser: 0.91 mg/dL (ref 0.44–1.00)
GFR calc non Af Amer: >60 mL/min (ref >60)
Glucose, Bld: 96 mg/dL (ref 65–99)
Potassium: 4.1 mmol/L (ref 3.5–5.1)
SODIUM: 135 mmol/L (ref 135–145)

## 2016-12-03 LAB — CBC
HCT: 38.7 % (ref 36.0–46.0)
Hemoglobin: 12.9 g/dL (ref 12.0–15.0)
MCH: 30.2 pg (ref 26.0–34.0)
MCHC: 33.3 g/dL (ref 30.0–36.0)
MCV: 90.6 fL (ref 78.0–100.0)
PLATELETS: 281 10*3/uL (ref 150–400)
RBC: 4.27 MIL/uL (ref 3.87–5.11)
RDW: 13.8 % (ref 11.5–15.5)
WBC: 7.4 10*3/uL (ref 4.0–10.5)

## 2016-12-03 MED ORDER — CIPROFLOXACIN HCL 500 MG PO TABS: 500.0000 mg | ORAL_TABLET | Freq: Two times a day (BID) | ORAL | 0 refills | Status: DC

## 2016-12-03 MED ORDER — METRONIDAZOLE 500 MG PO TABS: 500.0000 mg | ORAL_TABLET | Freq: Three times a day (TID) | ORAL | 0 refills | Status: DC

## 2016-12-03 MED ORDER — METRONIDAZOLE 500 MG PO TABS: 500.0000 mg | ORAL_TABLET | Freq: Three times a day (TID) | ORAL | Status: DC

## 2016-12-03 MED ORDER — CIPROFLOXACIN HCL 250 MG PO TABS
500.0000 mg | ORAL_TABLET | Freq: Two times a day (BID) | ORAL | Status: DC
  Administered 2016-12-03: 500 mg via ORAL
  Filled 2016-12-03: qty 2

## 2016-12-03 NOTE — Progress Notes (Signed)
Pt discharged home today per Dr. Tat. Pt's IV site D/C'd and WDL. Pt's VSS. Pt provided with home medication list, discharge instructions and prescriptions. Verbalized understanding. Pt ambulated off floor in stable condition accompanied by RN. 

## 2016-12-03 NOTE — Progress Notes (Signed)
  Subjective: Patient states her abdominal pain has significantly decreased since admission. She denies a right lower quadrant abdominal pain.  Objective: Vital signs in last 24 hours: Temp:  [97.1 F (36.2 C)-98.3 F (36.8 C)] 98.1 F (36.7 C) (06/26 0458) Pulse Rate:  [71-80] 71 (06/26 0458) Resp:  [16-18] 18 (06/26 0458) BP: (92-101)/(48-64) 92/48 (06/26 0458) SpO2:  [98 %-100 %] 98 % (06/26 0458) Last BM Date: 11/28/16  Intake/Output from previous day: 06/25 0701 - 06/26 0700 In: 2953.7 [P.O.:120; I.V.:1733.7; IV Piggyback:1100] Out: -  Intake/Output this shift: No intake/output data recorded.  General appearance: alert, cooperative and no distress GI: Soft with minimal tenderness in the suprapubic and left lower quadrant regions. No rigidity noted. No McBurney's point tenderness.  Lab Results:   Recent Labs  12/01/16 0512 12/03/16 0613  WBC 8.9 7.4  HGB 12.5 12.9  HCT 37.4 38.7  PLT 309 281   BMET  Recent Labs  12/01/16 0512 12/03/16 0613  NA 135 135  K 3.8 4.1  CL 102 105  CO2 25 24  GLUCOSE 92 96  BUN 9 8  CREATININE 0.89 0.91  CALCIUM 8.5* 8.4*   PT/INR No results for input(s): LABPROT, INR in the last 72 hours.  Studies/Results: No results found.  Anti-infectives: Anti-infectives    Start     Dose/Rate Route Frequency Ordered Stop   12/01/16 0130  ciprofloxacin (CIPRO) IVPB 400 mg     400 mg 200 mL/hr over 60 Minutes Intravenous Every 12 hours 12/01/16 0110     12/01/16 0130  metroNIDAZOLE (FLAGYL) IVPB 500 mg     500 mg 100 mL/hr over 60 Minutes Intravenous Every 8 hours 12/01/16 0110 12/11/16 0129      Assessment/Plan: Impression: Abdominal pain most likely secondary to sigmoid colitis/diverticulitis. Acute appendicitis less likely given clinical course. Plan: Okay for discharge from surgery standpoint. Would continue by mouth antibiotics for diverticulitis for 10 days. Follow up with me as needed.  LOS: 3 days    Aviva Signs 12/03/2016

## 2016-12-09 ENCOUNTER — Telehealth: Payer: Self-pay | Admitting: Emergency Medicine

## 2016-12-09 NOTE — Telephone Encounter (Signed)
LOST TO FOLLOWUP 

## 2017-01-08 HISTORY — PX: COLONOSCOPY: SHX174

## 2017-01-17 DIAGNOSIS — R197 Diarrhea, unspecified: Secondary | ICD-10-CM | POA: Diagnosis not present

## 2017-01-17 DIAGNOSIS — R933 Abnormal findings on diagnostic imaging of other parts of digestive tract: Secondary | ICD-10-CM | POA: Diagnosis not present

## 2017-01-17 DIAGNOSIS — K5792 Diverticulitis of intestine, part unspecified, without perforation or abscess without bleeding: Secondary | ICD-10-CM | POA: Diagnosis not present

## 2017-01-24 DIAGNOSIS — M25571 Pain in right ankle and joints of right foot: Secondary | ICD-10-CM | POA: Diagnosis not present

## 2017-01-24 DIAGNOSIS — M25572 Pain in left ankle and joints of left foot: Secondary | ICD-10-CM | POA: Diagnosis not present

## 2017-02-03 DIAGNOSIS — K5792 Diverticulitis of intestine, part unspecified, without perforation or abscess without bleeding: Secondary | ICD-10-CM | POA: Diagnosis not present

## 2017-02-03 DIAGNOSIS — K5730 Diverticulosis of large intestine without perforation or abscess without bleeding: Secondary | ICD-10-CM | POA: Diagnosis not present

## 2017-02-03 DIAGNOSIS — K635 Polyp of colon: Secondary | ICD-10-CM | POA: Diagnosis not present

## 2017-02-03 DIAGNOSIS — D123 Benign neoplasm of transverse colon: Secondary | ICD-10-CM | POA: Diagnosis not present

## 2017-02-03 DIAGNOSIS — R197 Diarrhea, unspecified: Secondary | ICD-10-CM | POA: Diagnosis not present

## 2017-02-06 ENCOUNTER — Ambulatory Visit: Payer: 59 | Admitting: Podiatry

## 2017-02-17 ENCOUNTER — Other Ambulatory Visit (HOSPITAL_COMMUNITY)
Admission: RE | Admit: 2017-02-17 | Discharge: 2017-02-17 | Disposition: A | Discharge status: Home / Self Care | Payer: 59 | Source: Ambulatory Visit | Attending: Obstetrics and Gynecology | Admitting: Obstetrics and Gynecology

## 2017-02-17 ENCOUNTER — Ambulatory Visit (INDEPENDENT_AMBULATORY_CARE_PROVIDER_SITE_OTHER): Payer: 59 | Admitting: Obstetrics and Gynecology

## 2017-02-17 ENCOUNTER — Encounter: Payer: Self-pay | Admitting: Obstetrics and Gynecology

## 2017-02-17 VITALS — BP 118/82 | HR 100 | Resp 18 | Ht 66.75 in | Wt 200.0 lb

## 2017-02-17 DIAGNOSIS — N951 Menopausal and female climacteric states: Secondary | ICD-10-CM

## 2017-02-17 DIAGNOSIS — F5231 Female orgasmic disorder: Secondary | ICD-10-CM

## 2017-02-17 DIAGNOSIS — R3915 Urgency of urination: Secondary | ICD-10-CM | POA: Diagnosis not present

## 2017-02-17 DIAGNOSIS — Z113 Encounter for screening for infections with a predominantly sexual mode of transmission: Secondary | ICD-10-CM | POA: Insufficient documentation

## 2017-02-17 DIAGNOSIS — IMO0002 Reserved for concepts with insufficient information to code with codable children: Secondary | ICD-10-CM

## 2017-02-17 DIAGNOSIS — N760 Acute vaginitis: Secondary | ICD-10-CM | POA: Insufficient documentation

## 2017-02-17 DIAGNOSIS — R350 Frequency of micturition: Secondary | ICD-10-CM | POA: Diagnosis not present

## 2017-02-17 DIAGNOSIS — B9689 Other specified bacterial agents as the cause of diseases classified elsewhere: Secondary | ICD-10-CM | POA: Insufficient documentation

## 2017-02-17 DIAGNOSIS — Z01419 Encounter for gynecological examination (general) (routine) without abnormal findings: Secondary | ICD-10-CM | POA: Diagnosis not present

## 2017-02-17 DIAGNOSIS — Z124 Encounter for screening for malignant neoplasm of cervix: Secondary | ICD-10-CM

## 2017-02-17 DIAGNOSIS — N898 Other specified noninflammatory disorders of vagina: Secondary | ICD-10-CM

## 2017-02-17 NOTE — Patient Instructions (Addendum)
You can try estroven or estroven PM  Once your mammogram is back, we can try the combipatch  EXERCISE AND DIET:  We recommended that you start or continue a regular exercise program for good health. Regular exercise means any activity that makes your heart beat faster and makes you sweat.  We recommend exercising at least 30 minutes per day at least 3 days a week, preferably 4 or 5.  We also recommend a diet low in fat and sugar.  Inactivity, poor dietary choices and obesity can cause diabetes, heart attack, stroke, and kidney damage, among others.    ALCOHOL AND SMOKING:  Women should limit their alcohol intake to no more than 7 drinks/beers/glasses of wine (combined, not each!) per week. Moderation of alcohol intake to this level decreases your risk of breast cancer and liver damage. And of course, no recreational drugs are part of a healthy lifestyle.  And absolutely no smoking or even second hand smoke. Most people know smoking can cause heart and lung diseases, but did you know it also contributes to weakening of your bones? Aging of your skin?  Yellowing of your teeth and nails?  CALCIUM AND VITAMIN D:  Adequate intake of calcium and Vitamin D are recommended.  The recommendations for exact amounts of these supplements seem to change often, but generally speaking 600 mg of calcium (either carbonate or citrate) and 800 units of Vitamin D per day seems prudent. Certain women may benefit from higher intake of Vitamin D.  If you are among these women, your doctor will have told you during your visit.    PAP SMEARS:  Pap smears, to check for cervical cancer or precancers,  have traditionally been done yearly, although recent scientific advances have shown that most women can have pap smears less often.  However, every woman still should have a physical exam from her gynecologist every year. It will include a breast check, inspection of the vulva and vagina to check for abnormal growths or skin changes, a  visual exam of the cervix, and then an exam to evaluate the size and shape of the uterus and ovaries.  And after 49 years of age, a rectal exam is indicated to check for rectal cancers. We will also provide age appropriate advice regarding health maintenance, like when you should have certain vaccines, screening for sexually transmitted diseases, bone density testing, colonoscopy, mammograms, etc.   MAMMOGRAMS:  All women over 49 years old should have a yearly mammogram. Many facilities now offer a "3D" mammogram, which may cost around $50 extra out of pocket. If possible,  we recommend you accept the option to have the 3D mammogram performed.  It both reduces the number of women who will be called back for extra views which then turn out to be normal, and it is better than the routine mammogram at detecting truly abnormal areas.    COLONOSCOPY:  Colonoscopy to screen for colon cancer is recommended for all women at age 12.  We know, you hate the idea of the prep.  We agree, BUT, having colon cancer and not knowing it is worse!!  Colon cancer so often starts as a polyp that can be seen and removed at colonscopy, which can quite literally save your life!  And if your first colonoscopy is normal and you have no family history of colon cancer, most women don't have to have it again for 10 years.  Once every ten years, you can do something that may end up  saving your life, right?  We will be happy to help you get it scheduled when you are ready.  Be sure to check your insurance coverage so you understand how much it will cost.  It may be covered as a preventative service at no cost, but you should check your particular policy.      Menopause and Hormone Replacement Therapy What is hormone replacement therapy? Hormone replacement therapy (HRT) is the use of artificial (synthetic) hormones to replace hormones that your body stops producing during menopause. Menopause is the normal time of life when menstrual  periods stop completely and the ovaries stop producing the female hormones estrogen and progesterone. This lack of hormones can affect your health and cause undesirable symptoms. HRT can relieve some of those symptoms. What are my options for HRT? HRT may consist of the synthetic hormones estrogen and progestin, or it may consist of only estrogen (estrogen-only therapy). You and your health care provider will decide which form of HRT is best for you. If you choose to be on HRT and you have a uterus, estrogen and progestin are usually prescribed. Estrogen-only therapy is used for women who do not have a uterus. Possible options for taking HRT include:  Pills.  Patches.  Gels.  Sprays.  Vaginal cream.  Vaginal rings.  Vaginal inserts.  The amount of hormone(s) that you take and how long you take the hormone(s) varies depending on your individual health. It is important to:  Begin HRT with the lowest possible dosage.  Stop HRT as soon as your health care provider tells you to stop.  Work with your health care provider so that you feel informed and comfortable with your decisions.  What are the benefits of HRT? HRT can reduce the frequency and severity of menopausal symptoms. Benefits of HRT vary depending on the menopausal symptoms that you have, the severity of your symptoms, and your overall health. HRT may help to improve the following menopausal symptoms:  Hot flashes and night sweats. These are sudden feelings of heat that spread over the face and body. The skin may turn red, like a blush. Night sweats are hot flashes that happen while you are sleeping or trying to sleep.  Bone loss (osteoporosis). The body loses calcium more quickly after menopause, causing the bones to become weaker. This can increase the risk for bone breaks (fractures).  Vaginal dryness. The lining of the vagina can become thin and dry, which can cause pain during sexual intercourse or cause infection,  burning, or itching.  Urinary tract infections.  Urinary incontinence. This is a decreased ability to control when you urinate.  Irritability.  Short-term memory problems.  What are the risks of HRT? Risks of HRT vary depending on your individual health and medical history. Risks of HRT also depend on whether you receive both estrogen and progestin or you receive estrogen only.HRT may increase the risk of:  Spotting. This is when a small amount of bloodleaks from the vagina unexpectedly.  Endometrial cancer. This cancer is in the lining of the uterus (endometrium).  Breast cancer.  Increased density of breast tissue. This can make it harder to find breast cancer on a breast X-ray (mammogram).  Stroke.  Heart attack.  Blood clots.  Gallbladder disease.  Risks of HRT can increase if you have any of the following conditions:  Endometrial cancer.  Liver disease.  Heart disease.  Breast cancer.  History of blood clots.  History of stroke.  How should I care  for myself while I am on HRT?  Take over-the-counter and prescription medicines only as told by your health care provider.  Get mammograms, pelvic exams, and medical checkups as often as told by your health care provider.  Have Pap tests done as often as told by your health care provider. A Pap test is sometimes called a Pap smear. It is a screening test that is used to check for signs of cancer of the cervix and vagina. A Pap test can also identify the presence of infection or precancerous changes. Pap tests may be done: ? Every 3 years, starting at age 40. ? Every 5 years, starting after age 64, in combination with testing for human papillomavirus (HPV). ? More often or less often depending on other medical conditions you have, your age, and other risk factors.  It is your responsibility to get your Pap test results. Ask your health care provider or the department performing the test when your results will be  ready.  Keep all follow-up visits as told by your health care provider. This is important. When should I seek medical care? Talk with your health care provider if:  You have any of these: ? Pain or swelling in your legs. ? Shortness of breath. ? Chest pain. ? Lumps or changes in your breasts or armpits. ? Slurred speech. ? Pain, burning, or bleeding when you urine.  You develop any of these: ? Unusual vaginal bleeding. ? Dizziness or headaches. ? Weakness or numbness in any part of your arms or legs. ? Pain in your abdomen.  This information is not intended to replace advice given to you by your health care provider. Make sure you discuss any questions you have with your health care provider. Document Released: 02/23/2003 Document Revised: 04/23/2016 Document Reviewed: 11/28/2014 Elsevier Interactive Patient Education  2017 [employer].

## 2017-02-17 NOTE — Progress Notes (Signed)
51 y.o. [id number] SingleCaucasianF here for annual exam. Patient is also c/o vaginal prolapse and vaginal discharge.  She had an endometrial ablation in 2016. LMP was in 4/18, PMP was in 3/18, prior to that was about 6 months.  She c/o hot flashes, bothersome, make her nauseas, not sleeping well, has hot flashes night and some night sweats.  She is up many times a night, tired during the day. The last 2.5 months have been miserable. No vaginal dryness.  At the end of June she was seen in the ER with abdominal pain, she was admitted a week later with diverticulitis. She was treated with antibiotics.  She is sexually active, same partner x 4 months, no pain.   She c/o a vaginal d/c for the last 3 weeks, creamy/white, no itching, burning or irritation.  She also c/o an intermittent vaginal bulge. Can be present for 2-3 weeks, then it gets better for up to a week. She notices it when she is wiping or sitting. She works for Production [employer], theatre/television/film, Water quality scientist, lifting/pulling involved. She has some urinary urgency, some frequency (small amounts), can take a while to empty. No dysuria. She drinks 3-4 cups of coffee a day (8-16 oz each). She c/o more difficulty having an orgasm, less intense  Patient's last menstrual period was 09/17/2016 (approximate).          Sexually active: Yes.    The current method of family planning is tubal ligation.    Exercising: Yes.    walking Smoker:  Yes, 1/2 PPD, trying to quit  Health Maintenance: Pap:  11/2014 WNL per patient  History of abnormal Pap: yes years ago had colposcopy done  MMG:  11-03-14 WNL  Colonoscopy:  02/03/2017- repeat in 5 yrs  BMD:   Never TDaP:  2016 Gardasil: N/A   reports that she has been smoking Cigarettes.  She has been smoking about 0.50 packs per day. She has never used smokeless tobacco. She reports that she does not drink alcohol or use drugs.  Past Medical History:  Diagnosis Date  . Allergy   . Complication of anesthesia     waking up too soon; severe itching and rash  . Depression   . GERD (gastroesophageal reflux disease)   . Plantar fasciitis   . PONV (postoperative nausea and vomiting)     Past Surgical History:  Procedure Laterality Date  . COLPOSCOPY    . DILITATION & CURRETTAGE/HYSTROSCOPY WITH NOVASURE ABLATION N/A 11/17/2014   Procedure: HYSTEROSCOPY, UTERINE CURETTAGE, ENDOMETRIAL ABLATION USING NOVASURE;  Surgeon: Jonnie Kind, MD;  Location: [facility];  Service: Gynecology;  Laterality: N/A;  Uterine Cavity Length 5.0cm Uterine Cavity Width 4.3cm Power 118 Time 1 minute 18 seconds  . ENDOMETRIAL ABLATION    . TUBAL LIGATION  12/1988  . WISDOM TOOTH EXTRACTION Bilateral 1996    Current Outpatient Prescriptions  Medication Sig Dispense Refill  . cetirizine (ZYRTEC) 10 MG tablet Take 10 mg by mouth daily.    Marland Kitchen omeprazole (PRILOSEC OTC) 20 MG tablet Take 20 mg by mouth daily.     No current facility-administered medications for this visit.     Family History  Problem Relation Age of Onset  . Hypertension Mother   . Hypertension Father   . ADD / ADHD Sister   . Depression Sister   . Diabetes Daughter   . Diabetes Maternal Grandmother   . Heart disease Maternal Grandmother   . Cancer Maternal Grandfather  throat, prostate  . Hypertension Maternal Grandfather   . Diabetes Paternal Grandmother   . Hypertension Paternal Grandmother   . Arthritis Paternal Grandmother   . ADD / ADHD Sister     Review of Systems  Constitutional: Negative.   HENT: Negative.   Eyes: Negative.   Respiratory: Negative.   Cardiovascular: Negative.   Gastrointestinal: Negative.   Endocrine: Negative.   Genitourinary: Positive for vaginal discharge.       Vaginal prolapse Hot flashes   Musculoskeletal: Negative.   Skin: Negative.   Allergic/Immunologic: Negative.   Neurological: Negative.   Psychiatric/Behavioral: Negative.     Exam:   BP 118/82 (BP Location: Right Arm, Patient Position:  Sitting, Cuff Size: Normal)   Pulse 100   Resp 18   Ht 5' 6.75" (1.695 m)   Wt 200 lb (90.7 kg)   LMP 09/17/2016 (Approximate)   BMI 31.56 kg/m   Weight change: @WEIGHTCHANGE @ Height:   Height: 5' 6.75" (169.5 cm)  Ht Readings from Last 3 Encounters:  02/17/17 5' 6.75" (1.695 m)  12/01/16 5\' 6"  (1.676 m)  11/16/16 5\' 6"  (1.676 m)    General appearance: alert, cooperative and appears stated age Head: Normocephalic, without obvious abnormality, atraumatic Neck: no adenopathy, supple, symmetrical, trachea midline and thyroid normal to inspection and palpation Lungs: clear to auscultation bilaterally Cardiovascular: regular rate and rhythm Breasts: normal appearance, no masses or tenderness Abdomen: soft, non-tender; bowel sounds normal; no masses,  no organomegaly Extremities: extremities normal, atraumatic, no cyanosis or edema Skin: Skin color, texture, turgor normal. No rashes or lesions Lymph nodes: Cervical, supraclavicular, and axillary nodes normal. No abnormal inguinal nodes palpated Neurologic: Grossly normal   Pelvic: External genitalia:  no lesions              Urethra:  normal appearing urethra with no masses, tenderness or lesions              Bartholins and Skenes: normal                 Vagina: normal appearing vagina, slight increase in watery, white vaginal d/c  Grade 1 uterine prolapse and rectocele, grade 2 cystocele with valsalva with standing              Cervix: no lesions               Bimanual Exam:  Uterus:  normal size, contour, position, consistency, mobility, non-tender              Adnexa: no mass, fullness, tenderness               Rectovaginal: Confirms               Anus:  normal sphincter tone, no lesions  Chaperone was present for exam.  A:  Well Woman with normal exam  Severe vasomotor symptoms  Prolapse  Vaginal d/c  Urinary frequency and urgency  Genital prolapse, discussed option of the pessary if it is bothering her.   Orgasmic  dysfunction  P:   Screening labs at work  STD testing  Pap with hpv  Mammogram  We discussed options of treating her vasomotor symptoms, including: herbal products, behavioral changes, SSRI's, Gabapentin, HRT. Reviewed risks and side effects   She would like to go on HRT, will get mammogram first, she will work on quitting cigarettes  If mammogram okay, will try the combipatch (if too expensive will do a patch and an oral progesterone)  Testosterone levels  Reading information given.

## 2017-02-18 ENCOUNTER — Other Ambulatory Visit: Payer: Self-pay | Admitting: Obstetrics and Gynecology

## 2017-02-18 DIAGNOSIS — Z1231 Encounter for screening mammogram for malignant neoplasm of breast: Secondary | ICD-10-CM

## 2017-02-18 LAB — URINALYSIS, MICROSCOPIC ONLY
Bacteria, UA: NONE SEEN
Casts: NONE SEEN /lpf

## 2017-02-18 LAB — URINE CULTURE: Organism ID, Bacteria: NO GROWTH

## 2017-02-19 LAB — CYTOLOGY - PAP
Bacterial vaginitis: POSITIVE — AB
CANDIDA VAGINITIS: NEGATIVE
CHLAMYDIA, DNA PROBE: NEGATIVE
Diagnosis: NEGATIVE
HPV: NOT DETECTED
Neisseria Gonorrhea: NEGATIVE
TRICH (WINDOWPATH): NEGATIVE

## 2017-02-19 LAB — HEP, RPR, HIV PANEL
HEP B S AG: NEGATIVE
HIV Screen 4th Generation wRfx: NONREACTIVE
RPR: NONREACTIVE

## 2017-02-19 LAB — TESTT+TESTF+SHBG
Sex Hormone Binding: 40.3 nmol/L (ref 24.6–122.0)
TESTOSTERONE, TOTAL: 14.2 ng/dL
Testosterone, Free: 1.1 pg/mL (ref 0.0–4.2)

## 2017-02-19 LAB — HEPATITIS C ANTIBODY

## 2017-02-20 ENCOUNTER — Telehealth: Payer: Self-pay | Admitting: *Deleted

## 2017-02-20 MED ORDER — METRONIDAZOLE 500 MG PO TABS: 500.0000 mg | ORAL_TABLET | Freq: Two times a day (BID) | ORAL | 0 refills | Status: DC

## 2017-02-20 NOTE — Telephone Encounter (Signed)
Spoke with patient and gave results and recommendations. Advised to avoid alcohol while taking the Flagyl. Patient voiced understanding. RX sent into pharmacy

## 2017-02-20 NOTE — Telephone Encounter (Signed)
-----   Message from Salvadore Dom, MD sent at 02/19/2017  6:15 PM EDT ----- Please inform the patient that her pap was + for BV and treat with flagyl (either oral or vaginal, her choice), no ETOH while on Flagyl.  Oral: Flagyl 500 mg BID x 7 days, or Vaginal: Metrogel, 1 applicator per vagina q day x 5 days. The rest of her lab work was negative  02 recall

## 2017-02-27 ENCOUNTER — Encounter: Payer: Self-pay | Admitting: Podiatry

## 2017-02-27 ENCOUNTER — Other Ambulatory Visit: Payer: Self-pay | Admitting: Podiatry

## 2017-02-27 ENCOUNTER — Ambulatory Visit (INDEPENDENT_AMBULATORY_CARE_PROVIDER_SITE_OTHER): Payer: 59 | Admitting: Podiatry

## 2017-02-27 ENCOUNTER — Ambulatory Visit (INDEPENDENT_AMBULATORY_CARE_PROVIDER_SITE_OTHER): Payer: 59

## 2017-02-27 ENCOUNTER — Ambulatory Visit: Payer: 59 | Admitting: Family Medicine

## 2017-02-27 VITALS — BP 99/59 | HR 101 | Resp 16

## 2017-02-27 DIAGNOSIS — M722 Plantar fascial fibromatosis: Secondary | ICD-10-CM

## 2017-02-27 DIAGNOSIS — M79672 Pain in left foot: Principal | ICD-10-CM

## 2017-02-27 DIAGNOSIS — M79671 Pain in right foot: Secondary | ICD-10-CM

## 2017-02-27 MED ORDER — METHYLPREDNISOLONE 4 MG PO TBPK: ORAL_TABLET | ORAL | 0 refills | Status: DC

## 2017-02-27 MED ORDER — MELOXICAM 15 MG PO TABS: 15.0000 mg | ORAL_TABLET | Freq: Every day | ORAL | 3 refills | Status: DC

## 2017-02-27 NOTE — Patient Instructions (Signed)

## 2017-02-27 NOTE — Progress Notes (Signed)
Subjective:  Patient ID: Lorraine Kelly, female    DOB: [birth year],  MRN: [id number] HPI Chief Complaint  Patient presents with  . Foot Pain    Plantar heel and arch bilateral (L>R) - aching, sharp pain intermittently since 2014, hx of PF in 2014 with treatment, noticing knots in arch now, tried Ibuporfen    51 y.o. female presents with the above complaint.   Presents today with chief complaint of pain to the plantar aspect of her left foot greater than the right. States that the arches been painful and aching for several months feels like sharp stabbing pain intermittently since 2014 she has a history of plantar fasciitis 2014 with treatment and she's recently noticed not a plantar aspect of her foot left greater than right. She's tried ibuprofen to no avail.  Past Medical History:  Diagnosis Date  . Allergy   . Complication of anesthesia    waking up too soon; severe itching and rash  . Depression   . GERD (gastroesophageal reflux disease)   . Plantar fasciitis   . PONV (postoperative nausea and vomiting)    Past Surgical History:  Procedure Laterality Date  . COLPOSCOPY    . DILITATION & CURRETTAGE/HYSTROSCOPY WITH NOVASURE ABLATION N/A 11/17/2014   Procedure: HYSTEROSCOPY, UTERINE CURETTAGE, ENDOMETRIAL ABLATION USING NOVASURE;  Surgeon: Jonnie Kind, MD;  Location: [facility];  Service: Gynecology;  Laterality: N/A;  Uterine Cavity Length 5.0cm Uterine Cavity Width 4.3cm Power 118 Time 1 minute 18 seconds  . ENDOMETRIAL ABLATION    . TUBAL LIGATION  12/1988  . WISDOM TOOTH EXTRACTION Bilateral 1996    Current Outpatient Prescriptions:  .  cetirizine (ZYRTEC) 10 MG tablet, Take 10 mg by mouth daily., Disp: , Rfl:  .  metroNIDAZOLE (FLAGYL) 500 MG tablet, Take 1 tablet (500 mg total) by mouth 2 (two) times daily., Disp: 14 tablet, Rfl: 0 .  omeprazole (PRILOSEC OTC) 20 MG tablet, Take 20 mg by mouth daily., Disp: , Rfl:   Allergies  Allergen Reactions  . Tape Swelling   "CLEAR ADHESIVE TAPE" pain   Review of Systems  All other systems reviewed and are negative.  Objective:   Vitals:   02/27/17 1458  BP: (!) 99/59  Pulse: (!) 101  Resp: 16   General AA&O x3. Patient is alert and oriented.  Vascular Dorsalis pedis and posterior tibial pulses 2/4 bilat. Brisk capillary refill to all digits. Pedal hair present.  Neurologic Epicritic sensation grossly intact.  Dermatologic No open lesions. Interspaces clear of maceration. Nails well groomed and normal in appearance.  Orthopedic: MMT 5/5 in dorsiflexion, plantarflexion, inversion, and eversion. Normal joint ROM without pain or crepitus.She has pain on palpation medial calcaneal tubercles bilateral. She also has small fibrous lesions noted on the medial longitudinal arch measuring less than a centimeter in diameter approximate some uterine half in length.    Radiographs:  3 views radiographs taken of each foot today demonstrating normal osseous architecture small calcaneal heel spurs with soft tissue increase in density at plantar fascia calcaneal insertion site indicative of plantar fasciitis. I see no calcification within the plantar fascia itself. No signs of any dysplasia.  Assessment & Plan:   Assessment: Plantar fasciitis bilateral. Plantar fibromatosis.   Plan: I spent greater than 30 minutes with her. At least half of this discussing pathology treatment plan physical therapy and treatment options. I injected the bilateral heels today with Kenalog and local anesthetic started her on a Medrol Dosepak to be followed by meloxicam.  Placed her in plantar fascia braces bilateral a single night splint and bilateral orthotics.     Max T. [place], [place]

## 2017-03-03 ENCOUNTER — Ambulatory Visit (HOSPITAL_COMMUNITY)
Admission: RE | Admit: 2017-03-03 | Discharge: 2017-03-03 | Disposition: A | Discharge status: Home / Self Care | Payer: 59 | Source: Ambulatory Visit | Attending: Obstetrics and Gynecology | Admitting: Obstetrics and Gynecology

## 2017-03-03 DIAGNOSIS — Z1231 Encounter for screening mammogram for malignant neoplasm of breast: Secondary | ICD-10-CM

## 2017-03-06 ENCOUNTER — Telehealth: Payer: Self-pay | Admitting: Obstetrics and Gynecology

## 2017-03-06 MED ORDER — ESTRADIOL-NORETHINDRONE ACET 0.05-0.25 MG/DAY TD PTTW: 1.0000 | MEDICATED_PATCH | TRANSDERMAL | 1 refills | Status: DC

## 2017-03-06 NOTE — Telephone Encounter (Signed)
Left message to call Kento Gossman at [phone].  

## 2017-03-06 NOTE — Telephone Encounter (Signed)
Patient returning your call.

## 2017-03-06 NOTE — Telephone Encounter (Signed)
Left message to call Avier Jech at [phone].  

## 2017-03-06 NOTE — Telephone Encounter (Signed)
Left detailed message, ok per current dpr, advised as seen below per Dr. Talbert Nan. Return call to office at [phone] to schedule 1 month f/u appointment.   Routing to provider for final review. Patient is agreeable to disposition. Will close encounter.

## 2017-03-06 NOTE — Telephone Encounter (Signed)
Will call in the combipatch for her (as we discussed), please set her up for a f/u appointment in 1 month.

## 2017-03-06 NOTE — Telephone Encounter (Signed)
1. Patient called to report she had her mammogram on Monday, 03/03/17, at [facility]. She said she needed to have this done before a medication was called in for her. Pharmacy on file is correct.  2. Patient also reports an antibiotic was prescribed to clear up an infection, which has gotten better, but she has now "developed a urinary tract infection." Patient offered but declined an appointment until speaking with the nurse.

## 2017-03-06 NOTE — Telephone Encounter (Signed)
Please have her send [place] her cholesterol panel when she gets it from work. It helps me calculate her CV risk. Then please close the encounter.

## 2017-03-06 NOTE — Telephone Encounter (Signed)
1. Spoke with patient. Reports "UTI" symptoms started 3 days ago. Pain with urination, discomfort when walking, urgency, "little bit of blood in urine and little bit of lower back pain". Denies fever/chills, N/V. Has taken AZO x1.   Advised OV needed for further evaluation, patient declined OV for today at 4pm. States she is unable to come in today or tomorrow d/t work schedule, will go Urgent Care. Advised patient to return call to office to schedule OV if symptoms still present.   2. Patient states MMG updated, request RX for HRT as previously discussed at 02/17/17 OV. Confirmed pharmacy on file.  03/03/17 MMG: Bi-Rads 1: Neg; screening MMG in 1 year  Advised patient will review with Dr. Talbert Nan and return call, patient verbalizes understanding and is agreeable.   Dr. Nelson Chimes, please advise on RX for HRT?

## 2017-03-07 NOTE — Telephone Encounter (Signed)
Spoke with patient, advised as seen below per Dr. Nelson Chimes. Patient declined to schedule f/u at this time, will return call. Patient verbalizes understanding and is agreeable.  Patient is agreeable to disposition. Will close encounter.

## 2017-03-18 ENCOUNTER — Telehealth: Payer: Self-pay | Admitting: Obstetrics and Gynecology

## 2017-03-18 MED ORDER — PROGESTERONE MICRONIZED 100 MG PO CAPS: 100.0000 mg | ORAL_CAPSULE | Freq: Every day | ORAL | 1 refills | Status: DC

## 2017-03-18 MED ORDER — ESTRADIOL 0.05 MG/24HR TD PTTW: 1.0000 | MEDICATED_PATCH | TRANSDERMAL | 1 refills | Status: DC

## 2017-03-18 NOTE — Telephone Encounter (Signed)
Spoke with patient, advised as seen below per Dr. Talbert Nan. Patient verbalizes understanding and is agreeable.  Patient is agreeable to disposition. Will close encounter.

## 2017-03-18 NOTE — Telephone Encounter (Signed)
Spoke with patient, advised of call to pharmacy, will review alternatives with Dr. Talbert Lorraine Kelly and return call with recommendations. Advised patient Dr. Talbert Lorraine Kelly is still seeing patients, response may not be immediate. Patient is agreeable.

## 2017-03-18 NOTE — Telephone Encounter (Signed)
Patient is retuning a call to [name]. 

## 2017-03-18 NOTE — Telephone Encounter (Signed)
Patient states insurance will only cover 30% of the Combipatch.  Patient requesting a different medication

## 2017-03-18 NOTE — Telephone Encounter (Signed)
Spoke with patient. Patient states she has met prescription deductible, would like alternative to Combipatch that is covered by insurance plan.   Recommended patient f/u with insurance provider for list of covered alternatives and return call to office. Will then review with Dr. Nelson Chimes. Patient verbalizes understanding and is agreeable.

## 2017-03-18 NOTE — Telephone Encounter (Signed)
Left message to call Lorraine Kelly at [phone].  

## 2017-03-18 NOTE — Telephone Encounter (Signed)
Spoke with patient. Returned call with tablet alternative to Combipatch. Patient provided: Activella, Mimvey, Nash Mantis.  Advised patient would review with Dr. Talbert Nan and return call.   Call to pharmacy, spoke with [facility], was advised Combipatch is $70 for 28 day supply.   Dr. Talbert Nan, [place] covered under insurance plan, patient requesting more affordable alternative, please advise?

## 2017-03-18 NOTE — Telephone Encounter (Signed)
I would recommend that she go on a generic patch and oral progesterone. The patch is safer for her than the oral estrogen. It is very important that she take the progesterone nightly to protect the lining of her endometrium.  I've called in a one month supply with one refill

## 2017-03-21 ENCOUNTER — Ambulatory Visit: Payer: 59 | Admitting: Family Medicine

## 2017-03-21 DIAGNOSIS — R140 Abdominal distension (gaseous): Secondary | ICD-10-CM | POA: Diagnosis not present

## 2017-03-21 DIAGNOSIS — R197 Diarrhea, unspecified: Secondary | ICD-10-CM | POA: Diagnosis not present

## 2017-03-21 DIAGNOSIS — K219 Gastro-esophageal reflux disease without esophagitis: Secondary | ICD-10-CM | POA: Diagnosis not present

## 2017-03-27 ENCOUNTER — Ambulatory Visit: Payer: 59 | Admitting: Podiatry

## 2017-03-28 ENCOUNTER — Other Ambulatory Visit (HOSPITAL_COMMUNITY)
Admission: RE | Admit: 2017-03-28 | Discharge: 2017-03-28 | Disposition: A | Discharge status: Home / Self Care | Payer: 59 | Source: Ambulatory Visit | Attending: Family Medicine | Admitting: Family Medicine

## 2017-03-28 ENCOUNTER — Encounter: Payer: Self-pay | Admitting: Family Medicine

## 2017-03-28 ENCOUNTER — Ambulatory Visit (INDEPENDENT_AMBULATORY_CARE_PROVIDER_SITE_OTHER): Payer: 59 | Admitting: Family Medicine

## 2017-03-28 VITALS — BP 110/80 | HR 82 | Temp 97.9°F | Resp 16 | Ht 66.5 in | Wt 199.0 lb

## 2017-03-28 DIAGNOSIS — R300 Dysuria: Secondary | ICD-10-CM | POA: Insufficient documentation

## 2017-03-28 DIAGNOSIS — Z720 Tobacco use: Secondary | ICD-10-CM | POA: Diagnosis not present

## 2017-03-28 DIAGNOSIS — R197 Diarrhea, unspecified: Secondary | ICD-10-CM | POA: Diagnosis not present

## 2017-03-28 LAB — URINALYSIS, COMPLETE (UACMP) WITH MICROSCOPIC
Bilirubin Urine: NEGATIVE
Glucose, UA: NEGATIVE mg/dL
Ketones, ur: NEGATIVE mg/dL
Nitrite: NEGATIVE
Protein, ur: NEGATIVE mg/dL
SPECIFIC GRAVITY, URINE: 1.017 (ref 1.005–1.030)
pH: 5 (ref 5.0–8.0)

## 2017-03-28 NOTE — Patient Instructions (Signed)
Steps to Quit Smoking Smoking tobacco can be bad for your health. It can also affect almost every organ in your body. Smoking puts you and people around you at risk for many serious long-lasting (chronic) diseases. Quitting smoking is hard, but it is one of the best things that you can do for your health. It is never too late to quit. What are the benefits of quitting smoking? When you quit smoking, you lower your risk for getting serious diseases and conditions. They can include:  Lung cancer or lung disease.  Heart disease.  Stroke.  Heart attack.  Not being able to have children (infertility).  Weak bones (osteoporosis) and broken bones (fractures).  If you have coughing, wheezing, and shortness of breath, those symptoms may get better when you quit. You may also get sick less often. If you are pregnant, quitting smoking can help to lower your chances of having a baby of low birth weight. What can I do to help me quit smoking? Talk with your doctor about what can help you quit smoking. Some things you can do (strategies) include:  Quitting smoking totally, instead of slowly cutting back how much you smoke over a period of time.  Going to in-person counseling. You are more likely to quit if you go to many counseling sessions.  Using resources and support systems, such as: ? Online chats with a counselor. ? Phone quitlines. ? Printed self-help materials. ? Support groups or group counseling. ? Text messaging programs. ? Mobile phone apps or applications.  Taking medicines. Some of these medicines may have nicotine in them. If you are pregnant or breastfeeding, do not take any medicines to quit smoking unless your doctor says it is okay. Talk with your doctor about counseling or other things that can help you.  Talk with your doctor about using more than one strategy at the same time, such as taking medicines while you are also going to in-person counseling. This can help make  quitting easier. What things can I do to make it easier to quit? Quitting smoking might feel very hard at first, but there is a lot that you can do to make it easier. Take these steps:  Talk to your family and friends. Ask them to support and encourage you.  Call phone quitlines, reach out to support groups, or work with a counselor.  Ask people who smoke to not smoke around you.  Avoid places that make you want (trigger) to smoke, such as: ? Bars. ? Parties. ? Smoke-break areas at work.  Spend time with people who do not smoke.  Lower the stress in your life. Stress can make you want to smoke. Try these things to help your stress: ? Getting regular exercise. ? Deep-breathing exercises. ? Yoga. ? Meditating. ? Doing a body scan. To do this, close your eyes, focus on one area of your body at a time from head to toe, and notice which parts of your body are tense. Try to relax the muscles in those areas.  Download or buy apps on your mobile phone or tablet that can help you stick to your quit plan. There are many free apps, such as QuitGuide from the CDC (Centers for Disease Control and Prevention). You can find more support from smokefree.gov and other websites.  This information is not intended to replace advice given to you by your health care provider. Make sure you discuss any questions you have with your health care provider. Document Released: 03/23/2009 Document   Revised: 01/23/2016 Document Reviewed: 10/11/2014 Elsevier Interactive Patient Education  2018 Elsevier Inc.  

## 2017-03-28 NOTE — Progress Notes (Signed)
Patient ID: Lorraine Kelly, female    DOB: [birth year], 51 y.o.   MRN: [id number]  Chief Complaint  Patient presents with  . Urinary Tract Infection    patient states she can not be treated yet because of hydrogen-'something' test. Cannot have abx 14 days prior    Allergies Other and Tape  Subjective:   Lorraine Kelly is a 51 y.o. female who presents to [facility] today.  HPI Here to establish care. Has not really had a PCP in the past and has just been seen by gynecology. Is currently being seen and evaluated at [facility] for chronic diarrhea. Reports that is followed by gastroenterology there b/c of diarrhea. Has not had a "solid bowel movement since May".   Comes in today to establish care. Reports that she feels like she has a UTI. Reports that a week or two ago started with burning with urination. Now it is not burning but urine has a bad odor and has some frequency. Reports that it is no longer burning with urination. Has had some UTI in the past but reports usually just picked up on routine testing. Denies any abnormal vaginal discharge. Is sexually active. Was treated for BV when had her pap smear done and would like to be checked today for any vaginal infections. Does not want any blood testing for STDs.    Urinary Tract Infection   This is a new problem. The current episode started 1 to 4 weeks ago. The problem occurs intermittently. The quality of the pain is described as burning. Associated symptoms include frequency and urgency. Pertinent negatives include no chills, flank pain, hematuria, nausea, possible pregnancy, sweats or vomiting.    Past Medical History:  Diagnosis Date  . Allergy   . Complication of anesthesia    waking up too soon; severe itching and rash  . Depression   . GERD (gastroesophageal reflux disease)   . Plantar fasciitis   . PONV (postoperative nausea and vomiting)     Past Surgical History:  Procedure Laterality Date  . COLPOSCOPY    .  DILITATION & CURRETTAGE/HYSTROSCOPY WITH NOVASURE ABLATION N/A 11/17/2014   Procedure: HYSTEROSCOPY, UTERINE CURETTAGE, ENDOMETRIAL ABLATION USING NOVASURE;  Surgeon: Jonnie Kind, MD;  Location: [facility];  Service: Gynecology;  Laterality: N/A;  Uterine Cavity Length 5.0cm Uterine Cavity Width 4.3cm Power 118 Time 1 minute 18 seconds  . ENDOMETRIAL ABLATION    . TUBAL LIGATION  12/1988  . WISDOM TOOTH EXTRACTION Bilateral 1996    Family History  Problem Relation Age of Onset  . Hypertension Mother   . Hypertension Father   . ADD / ADHD Sister   . Depression Sister   . Diabetes Daughter   . Diabetes Maternal Grandmother   . Heart disease Maternal Grandmother   . Cancer Maternal Grandfather        throat, prostate  . Hypertension Maternal Grandfather   . Diabetes Paternal Grandmother   . Hypertension Paternal Grandmother   . Arthritis Paternal Grandmother   . ADD / ADHD Sister      Social History   Social History  . Marital status: Single    Spouse name: N/A  . Number of children: N/A  . Years of education: N/A   Social History Main Topics  . Smoking status: Current Every Day Smoker    Packs/day: 0.50    Years: 20.00    Types: Cigarettes  . Smokeless tobacco: Never Used  . Alcohol use No  .  Drug use: No  . Sexual activity: Yes    Partners: Male    Birth control/ protection: Surgical   Other Topics Concern  . None   Social History Narrative   Works at [employer] and [employer]. Grew up in [place], [place]. Single. Has three children.    Current Outpatient Prescriptions on File Prior to Visit  Medication Sig Dispense Refill  . cetirizine (ZYRTEC) 10 MG tablet Take 10 mg by mouth daily.    Marland Kitchen estradiol (VIVELLE-DOT) 0.05 MG/24HR patch Place 1 patch (0.05 mg total) onto the skin 2 (two) times a week. 8 patch 1  . meloxicam (MOBIC) 15 MG tablet Take 1 tablet (15 mg total) by mouth daily. 30 tablet 3  . omeprazole (PRILOSEC OTC) 20 MG tablet Take 20 mg by mouth daily.    .  progesterone (PROMETRIUM) 100 MG capsule Take 1 capsule (100 mg total) by mouth daily. 30 capsule 1   No current facility-administered medications on file prior to visit.      Review of Systems  Constitutional: Negative for activity change, appetite change, chills, fever and unexpected weight change.  Respiratory: Negative for cough and shortness of breath.   Cardiovascular: Negative for chest pain and leg swelling.  Gastrointestinal: Positive for diarrhea. Negative for abdominal pain, blood in stool, nausea and vomiting.  Genitourinary: Positive for frequency and urgency. Negative for decreased urine volume, difficulty urinating, dyspareunia, dysuria, flank pain, genital sores, hematuria, pelvic pain, vaginal bleeding, vaginal discharge and vaginal pain.  Skin: Negative for rash.  Neurological: Negative for dizziness and light-headedness.  Hematological: Negative for adenopathy. Does not bruise/bleed easily.  Psychiatric/Behavioral: Negative for agitation, behavioral problems and dysphoric mood. The patient is not nervous/anxious.      Objective:   BP 110/80 (BP Location: Left Arm, Patient Position: Sitting, Cuff Size: Normal)   Pulse 82   Temp 97.9 F (36.6 C) (Other (Comment))   Resp 16   Ht 5' 6.5" (1.689 m)   Wt 199 lb (90.3 kg)   SpO2 97%   BMI 31.64 kg/m   Physical Exam  Constitutional: She is oriented to person, place, and time. She appears well-developed and well-nourished.  HENT:  Head: Normocephalic and atraumatic.  Mouth/Throat: No oropharyngeal exudate.  Eyes: Pupils are equal, round, and reactive to light. Conjunctivae and EOM are normal.  Neck: Normal range of motion. Neck supple. No JVD present. No thyromegaly present.  Cardiovascular: Normal rate, regular rhythm and normal heart sounds.  Exam reveals no friction rub.   No murmur heard. Pulmonary/Chest: Effort normal and breath sounds normal. No respiratory distress. She has no wheezes.  Abdominal: Soft.  Bowel sounds are normal. She exhibits no distension.  Lymphadenopathy:    She has no cervical adenopathy.  Neurological: She is alert and oriented to person, place, and time.  Skin: Skin is warm and dry. No rash noted.  Psychiatric: She has a normal mood and affect. Her behavior is normal. Judgment and thought content normal.     Assessment and Plan   1. Dysuria Done. Will check urine for infection and check for STDs. Defers blood testing for STDs.  - Urine Culture - Urine Microscopic - Urine cytology ancillary only - Urinalysis, Routine w reflex microscopic  2. Diarrhea, unspecified type Check today. Continue visits/follow up at [facility]. Requested to have labs/OV sent to our office.  - TSH  3. Tobacco abuse disorder The 5 A's Model for treating Tobacco Use and Dependence was used today. I have identified and documented tobacco  use status for this patient. I have urged the patient to quit tobacco use. At this time, the patient is unwilling and not ready to attempt to quit. I have provided patient with information regarding risks, cessation techniques, and interventions that might increase future attempts to quit smoking. I will plan on again addressing tobacco dependence at the next visit.  Return for scheule physical. Caren Macadam, MD 03/29/2017

## 2017-03-29 LAB — URINALYSIS, MICROSCOPIC ONLY: Hyaline Cast: NONE SEEN /LPF

## 2017-03-29 LAB — TSH: TSH: 1.96 mIU/L

## 2017-03-31 LAB — URINE CULTURE
Culture: >=100000 — AB
MICRO NUMBER:: 81172357
SPECIMEN QUALITY: ADEQUATE

## 2017-04-01 ENCOUNTER — Telehealth: Payer: Self-pay | Admitting: Family Medicine

## 2017-04-01 ENCOUNTER — Encounter: Payer: Self-pay | Admitting: Family Medicine

## 2017-04-01 ENCOUNTER — Encounter: Payer: Self-pay | Admitting: Podiatry

## 2017-04-01 ENCOUNTER — Ambulatory Visit (INDEPENDENT_AMBULATORY_CARE_PROVIDER_SITE_OTHER): Payer: 59 | Admitting: Podiatry

## 2017-04-01 DIAGNOSIS — M722 Plantar fascial fibromatosis: Secondary | ICD-10-CM

## 2017-04-01 LAB — URINE CYTOLOGY ANCILLARY ONLY
Chlamydia: NEGATIVE
Neisseria Gonorrhea: NEGATIVE
TRICH (WINDOWPATH): NEGATIVE

## 2017-04-01 MED ORDER — CIPROFLOXACIN HCL 500 MG PO TABS: 500.0000 mg | ORAL_TABLET | Freq: Two times a day (BID) | ORAL | 0 refills | Status: DC

## 2017-04-01 NOTE — Telephone Encounter (Signed)
Please call patient and advise her that she does indeed have a UTI. She will need to complete the antibiotic and we will repeat her urine for clearance of the abnormalities when she follows back up.

## 2017-04-01 NOTE — Patient Instructions (Signed)

## 2017-04-01 NOTE — Progress Notes (Signed)
Dispensed patient's orthotics with oral and written instructions for wearing. Patient will follow up with Dr. Hyatt in 1 month for an orthotic check. 

## 2017-04-01 NOTE — Telephone Encounter (Signed)
Called patient regarding message below. No answer, unable to leave message.  

## 2017-04-02 LAB — URINE CYTOLOGY ANCILLARY ONLY
BACTERIAL VAGINITIS: NEGATIVE
Candida vaginitis: NEGATIVE

## 2017-04-02 NOTE — Telephone Encounter (Signed)
Please try again. Gwen Her. Mannie Stabile, MD

## 2017-04-03 NOTE — Telephone Encounter (Signed)
Left detailed message on phone per [facility]

## 2017-04-28 ENCOUNTER — Encounter: Payer: Self-pay | Admitting: Family Medicine

## 2017-04-28 ENCOUNTER — Telehealth: Payer: Self-pay | Admitting: *Deleted

## 2017-04-28 NOTE — Telephone Encounter (Signed)
Patient called stating she was treated for a uti a couple weeks ago and her uti is back. Please advise

## 2017-04-28 NOTE — Telephone Encounter (Signed)
Patient needs an appointment. Tried to call patient-busy signal

## 2017-04-28 NOTE — Telephone Encounter (Signed)
Please call patient and advise that she needs to seen for this and to have a urine and urine culture performed. Gwen Her. Mannie Stabile, MD

## 2017-05-06 NOTE — Telephone Encounter (Signed)
Pt called and advise her she had to make an appt. Pt is seeing provider tomorrow

## 2017-05-07 ENCOUNTER — Other Ambulatory Visit (HOSPITAL_COMMUNITY)
Admission: RE | Admit: 2017-05-07 | Discharge: 2017-05-07 | Disposition: A | Discharge status: Home / Self Care | Payer: 59 | Source: Ambulatory Visit | Attending: Family Medicine | Admitting: Family Medicine

## 2017-05-07 ENCOUNTER — Other Ambulatory Visit: Payer: Self-pay

## 2017-05-07 ENCOUNTER — Ambulatory Visit: Payer: 59 | Admitting: Family Medicine

## 2017-05-07 ENCOUNTER — Other Ambulatory Visit (HOSPITAL_COMMUNITY)
Admission: AD | Admit: 2017-05-07 | Discharge: 2017-05-07 | Disposition: A | Discharge status: Home / Self Care | Payer: 59 | Source: Skilled Nursing Facility | Attending: Family Medicine | Admitting: Family Medicine

## 2017-05-07 ENCOUNTER — Encounter: Payer: Self-pay | Admitting: Family Medicine

## 2017-05-07 VITALS — BP 120/70 | HR 105 | Temp 98.4°F | Resp 16 | Ht 66.5 in

## 2017-05-07 DIAGNOSIS — N3001 Acute cystitis with hematuria: Secondary | ICD-10-CM | POA: Diagnosis not present

## 2017-05-07 DIAGNOSIS — N760 Acute vaginitis: Secondary | ICD-10-CM | POA: Diagnosis not present

## 2017-05-07 DIAGNOSIS — R3915 Urgency of urination: Secondary | ICD-10-CM

## 2017-05-07 DIAGNOSIS — B9689 Other specified bacterial agents as the cause of diseases classified elsewhere: Secondary | ICD-10-CM | POA: Insufficient documentation

## 2017-05-07 LAB — POCT URINALYSIS DIPSTICK
Bilirubin, UA: NEGATIVE
Glucose, UA: NEGATIVE
KETONES UA: NEGATIVE
LEUKOCYTES UA: NEGATIVE
NITRITE UA: NEGATIVE
PH UA: 6 (ref 5.0–8.0)
PROTEIN UA: NEGATIVE
Spec Grav, UA: 1.025 (ref 1.010–1.025)
UROBILINOGEN UA: 0.2 U/dL

## 2017-05-07 MED ORDER — METRONIDAZOLE 500 MG PO TABS: 500.0000 mg | ORAL_TABLET | Freq: Two times a day (BID) | ORAL | 0 refills | Status: DC

## 2017-05-07 MED ORDER — CIPROFLOXACIN HCL 500 MG PO TABS: 500.0000 mg | ORAL_TABLET | Freq: Two times a day (BID) | ORAL | 0 refills | Status: DC

## 2017-05-07 NOTE — Progress Notes (Signed)
Patient ID: Lorraine Kelly, female    DOB: 02-10-1966, 51 y.o.   MRN: [id number]  Chief Complaint  Patient presents with  . Urinary Urgency  . Urinary Frequency  . Dysuria  . Vaginal Discharge  . Other    urine odor    Allergies Other and Tape  Subjective:   Lorraine Kelly is a 51 y.o. female who presents to [facility] today.  HPI Reports that about five days ago had symptoms of a UTI. Urgency, dysuria, frequency.  Reports that he has had multiple urinary tract infections over the past year.  Urinates before and after intercourse.  Cultures have grown out E. coli and other types of bacteria.  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  Has also had some vaginal discharge similar to when she had bacterial vaginosis.  Has been checked multiple times for sexually transmitted infections.  Has no history of prior sexually transmitted infections.  Reports that she has discharge that is thin, whitish clear in color and has a foul odor.  Reports that she does sweat a lot at work and believes this worsens the discharge.  Denies any vaginal itching.   Urinary Frequency   This is a new problem. The current episode started in the past 7 days. The problem occurs intermittently. The problem has been waxing and waning. The quality of the pain is described as burning. The pain is at a severity of 3/10. The pain is mild. There has been no fever. She is sexually active. There is no history of pyelonephritis. Pertinent negatives include no chills, flank pain, hematuria, nausea, urgency or vomiting. She has tried increased fluids for the symptoms. The treatment provided no relief.  Vaginal Discharge  The patient's primary symptoms include a genital odor and vaginal discharge. The patient's pertinent negatives include no genital itching, genital lesions, genital  rash, missed menses, pelvic pain or vaginal bleeding. This is a new problem. The current episode started 1 to 4 weeks ago. The problem occurs constantly. The problem has been unchanged. The patient is experiencing no pain. She is not pregnant. Associated symptoms include dysuria. Pertinent negatives include no abdominal pain, anorexia, back pain, chills, constipation, fever, flank pain, hematuria, nausea, painful intercourse, urgency or vomiting. The vaginal discharge was copious, malodorous, watery and white. There has been no bleeding. Nothing aggravates the symptoms. She has tried nothing for the symptoms. She is sexually active. No, her partner does not have an STD. Her past medical history is significant for vaginosis. There is no history of an abdominal surgery, an ectopic pregnancy, endometriosis, herpes simplex, PID or an STD.    Past Medical History:  Diagnosis Date  . Allergy   . Complication of anesthesia    waking up too soon; severe itching and rash  . Depression   . GERD (gastroesophageal reflux disease)   . Plantar fasciitis   . PONV (postoperative nausea and vomiting)     Past Surgical History:  Procedure Laterality Date  . COLPOSCOPY    . DILITATION & CURRETTAGE/HYSTROSCOPY WITH NOVASURE ABLATION N/A 11/17/2014   Procedure: HYSTEROSCOPY, UTERINE CURETTAGE, ENDOMETRIAL ABLATION USING NOVASURE;  Surgeon: Jonnie Kind, MD;  Location: [facility];  Service: Gynecology;  Laterality: N/A;  Uterine Cavity Length 5.0cm Uterine Cavity Width 4.3cm Power 118 Time 1 minute 18  seconds  . ENDOMETRIAL ABLATION    . TUBAL LIGATION  12/1988  . WISDOM TOOTH EXTRACTION Bilateral 1996    Family History  Problem Relation Age of Onset  . Hypertension Mother   . Hypertension Father   . ADD / ADHD Sister   . Depression Sister   . Diabetes Daughter   . Diabetes Maternal Grandmother   . Heart disease Maternal Grandmother   . Cancer Maternal Grandfather        throat, prostate  . Hypertension  Maternal Grandfather   . Diabetes Paternal Grandmother   . Hypertension Paternal Grandmother   . Arthritis Paternal Grandmother   . ADD / ADHD Sister      Social History   Socioeconomic History  . Marital status: Single    Spouse name: None  . Number of children: None  . Years of education: None  . Highest education level: None  Social Needs  . Financial resource strain: None  . Food insecurity - worry: None  . Food insecurity - inability: None  . Transportation needs - medical: None  . Transportation needs - non-medical: None  Occupational History  . None  Tobacco Use  . Smoking status: Current Every Day Smoker    Packs/day: 0.50    Years: 20.00    Pack years: 10.00    Types: Cigarettes  . Smokeless tobacco: Never Used  Substance and Sexual Activity  . Alcohol use: No  . Drug use: No  . Sexual activity: Yes    Partners: Male    Birth control/protection: Surgical  Other Topics Concern  . None  Social History Narrative   Works at [employer] and [employer]. Grew up in [place], [place]. Single. Has three children.     Review of Systems  Constitutional: Negative for chills and fever.  Gastrointestinal: Negative for abdominal pain, anorexia, constipation, nausea and vomiting.  Genitourinary: Positive for dysuria and vaginal discharge. Negative for flank pain, hematuria, missed menses, pelvic pain and urgency.  Musculoskeletal: Negative for back pain.     Objective:   BP 120/70 (BP Location: Left Arm, Patient Position: Sitting, Cuff Size: Normal)   Pulse (!) 105   Temp 98.4 F (36.9 C) (Other (Comment))   Resp 16   Ht 5' 6.5" (1.689 m)   SpO2 97%   BMI 31.64 kg/m   Physical Exam  Constitutional: She appears well-developed and well-nourished.  HENT:  Head: Normocephalic and atraumatic.  Eyes: EOM are normal. Pupils are equal, round, and reactive to light.  Cardiovascular: Normal rate and regular rhythm.  Pulmonary/Chest: Effort normal and breath sounds normal.    Abdominal: Soft. Bowel sounds are normal. She exhibits no distension. There is no tenderness. There is no guarding.  No CVA tenderness to palpation.  Skin: Skin is warm and dry. No rash noted.  Psychiatric: She has a normal mood and affect. Her behavior is normal. Thought content normal.  Vitals reviewed.    Assessment and Plan   1. Urinary urgency  - POCT Urinalysis Dipstick  2. Acute cystitis with hematuria Urinalysis performed and reviewed with patient in office today.  We will send urine for culture.  At this time we will go ahead and treat patient for UTI given her symptoms and history.  Patient was given a note for work stating that due to medical reasons she needs to be allowed to go to the bathroom when needed.  We discussed other risk factors for UTI.  Will await culture results we discussed with patient that  if she does continue to get more urinary tract infections that we may need to do prophylactic medications versus referral to urologist for further evaluation.  I did asked patient to return to clinic in 3 weeks to leave a urine specimen for culture to make sure that the infection has cleared. - ciprofloxacin (CIPRO) 500 MG tablet; Take 1 tablet (500 mg total) by mouth 2 (two) times daily.  Dispense: 14 tablet; Refill: 0 - Urine Culture Patient was counseled to increase fluids.  Call with any questions or concerns or worrisome symptoms. 3. Bacterial vaginosis Patient counseled in detail regarding the risks of medication. Told to call or return to clinic if develop any worrisome signs or symptoms. Patient voiced understanding.  Testing for infection performed today.  We will go ahead and treat due to her symptoms and prior infections.  She was told to refrain from alcohol while taking this medication due to possible side effects. - metroNIDAZOLE (FLAGYL) 500 MG tablet; Take 1 tablet (500 mg total) by mouth 2 (two) times daily.  Dispense: 14 tablet; Refill: 0 - Urine cytology  ancillary only Office visit was greater than 25 minutes.  Greater than 50% spent counseling and coordinating care.  Face-to-face time greater than 50% of office visit. No Follow-up on file. Caren Macadam, MD 05/07/2017

## 2017-05-08 ENCOUNTER — Ambulatory Visit: Payer: 59 | Admitting: Podiatry

## 2017-05-08 LAB — URINE CYTOLOGY ANCILLARY ONLY
Chlamydia: NEGATIVE
Neisseria Gonorrhea: NEGATIVE
Trichomonas: NEGATIVE

## 2017-05-10 LAB — URINE CULTURE: Culture: >=100000 — AB

## 2017-05-11 ENCOUNTER — Encounter: Payer: Self-pay | Admitting: Family Medicine

## 2017-05-12 LAB — URINE CYTOLOGY ANCILLARY ONLY: CANDIDA VAGINITIS: NEGATIVE

## 2017-05-13 ENCOUNTER — Encounter: Payer: Self-pay | Admitting: Family Medicine

## 2017-05-22 ENCOUNTER — Other Ambulatory Visit: Payer: Self-pay | Admitting: Obstetrics and Gynecology

## 2017-05-22 MED ORDER — PROGESTERONE MICRONIZED 100 MG PO CAPS: 100.0000 mg | ORAL_CAPSULE | Freq: Every day | ORAL | 0 refills | Status: DC

## 2017-05-22 NOTE — Telephone Encounter (Signed)
Medication refill request: Vivelle patch  Last AEX:  02-17-17  Next AEX: not scheduled yet Last MMG (if hormonal medication request): 03-03-17 WNL  Refill authorized: please advise

## 2017-05-22 NOTE — Telephone Encounter (Signed)
The patient should also need prometrium. Please make sure she is taking the prometrium daily. She should come in for a medication check, please schedule this. I will call in one month of the estradiol and the prometrium.

## 2017-05-25 ENCOUNTER — Emergency Department (HOSPITAL_COMMUNITY)
Admission: EM | Admit: 2017-05-25 | Discharge: 2017-05-25 | Disposition: A | Discharge status: Home / Self Care | Payer: 59 | Attending: Emergency Medicine | Admitting: Emergency Medicine

## 2017-05-25 ENCOUNTER — Other Ambulatory Visit: Payer: Self-pay

## 2017-05-25 ENCOUNTER — Encounter (HOSPITAL_COMMUNITY): Payer: Self-pay | Admitting: Emergency Medicine

## 2017-05-25 ENCOUNTER — Emergency Department (HOSPITAL_COMMUNITY): Payer: 59

## 2017-05-25 DIAGNOSIS — R079 Chest pain, unspecified: Secondary | ICD-10-CM | POA: Diagnosis not present

## 2017-05-25 DIAGNOSIS — R0789 Other chest pain: Secondary | ICD-10-CM | POA: Insufficient documentation

## 2017-05-25 DIAGNOSIS — Z79899 Other long term (current) drug therapy: Secondary | ICD-10-CM | POA: Insufficient documentation

## 2017-05-25 DIAGNOSIS — F17210 Nicotine dependence, cigarettes, uncomplicated: Secondary | ICD-10-CM | POA: Diagnosis not present

## 2017-05-25 LAB — BASIC METABOLIC PANEL
Anion gap: 9 (ref 5–15)
BUN: 8 mg/dL (ref 6–20)
CALCIUM: 9.1 mg/dL (ref 8.9–10.3)
CO2: 22 mmol/L (ref 22–32)
CREATININE: 0.71 mg/dL (ref 0.44–1.00)
Chloride: 106 mmol/L (ref 101–111)
GFR calc Af Amer: >60 mL/min (ref >60)
GLUCOSE: 94 mg/dL (ref 65–99)
Potassium: 4.3 mmol/L (ref 3.5–5.1)
Sodium: 137 mmol/L (ref 135–145)

## 2017-05-25 LAB — CBC
HCT: 41.7 % (ref 36.0–46.0)
HEMOGLOBIN: 13.8 g/dL (ref 12.0–15.0)
MCH: 30.4 pg (ref 26.0–34.0)
MCHC: 33.1 g/dL (ref 30.0–36.0)
MCV: 91.9 fL (ref 78.0–100.0)
Platelets: 292 10*3/uL (ref 150–400)
RBC: 4.54 MIL/uL (ref 3.87–5.11)
RDW: 14.1 % (ref 11.5–15.5)
WBC: 10.7 10*3/uL — ABNORMAL HIGH (ref 4.0–10.5)

## 2017-05-25 LAB — I-STAT TROPONIN, ED: TROPONIN I, POC: 0.01 ng/mL (ref 0.00–0.08)

## 2017-05-25 LAB — D-DIMER, QUANTITATIVE: D-Dimer, Quant: <0.27 ug/mL-FEU (ref 0.00–0.50)

## 2017-05-25 NOTE — ED Triage Notes (Signed)
Pt reports b/p is higher than normal and has had chest pain x 10 hours. Hx of smoking

## 2017-05-25 NOTE — ED Provider Notes (Addendum)
[facility] EMERGENCY DEPARTMENT Provider Note   CSN: [id number] Arrival date & time: 05/25/17  1318     History   Chief Complaint Chief Complaint  Patient presents with  . Chest Pain    HPI Lorraine Kelly is a 51 y.o. female.  HPI Patient became lightheaded at work last night 10 PM while standing.  Felt "run down throughout the night" she took her blood pressure at 11 PM last night at work and it was noted to be 149/90.  This morning at 10:30 AM she developed anterior chest pain, nonradiating pain is worse with standing and she noticed it somewhat  worsened with walking this morning.  It is pleuritic in nature.  Anterior nonradiating.  No treatment prior to coming here.  Improved with lying down.  No other associated symptoms. Past Medical History:  Diagnosis Date  . Allergy   . Complication of anesthesia    waking up too soon; severe itching and rash  . Depression   . GERD (gastroesophageal reflux disease)   . Plantar fasciitis   . PONV (postoperative nausea and vomiting)     Patient Active Problem List   Diagnosis Date Noted  . Acute diverticulitis 12/01/2016  . Obesity 12/01/2016  . Abdominal pain 11/30/2016  . Allergy 11/30/2016  . Heavy menses 11/17/2014  . Premenstrual symptom 11/17/2014  . 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 Smoker 07/29/2013  . GERD (gastroesophageal reflux disease) 07/29/2013  . Seasonal allergies 07/29/2013    Past Surgical History:  Procedure Laterality Date  . COLPOSCOPY    . DILITATION & CURRETTAGE/HYSTROSCOPY WITH NOVASURE ABLATION N/A 11/17/2014   Procedure: HYSTEROSCOPY, UTERINE CURETTAGE, ENDOMETRIAL ABLATION USING NOVASURE;  Surgeon: Jonnie Kind, MD;  Location: [facility];  Service: Gynecology;  Laterality: N/A;  Uterine Cavity Length 5.0cm Uterine Cavity Width 4.3cm Power 118 Time 1 minute 18 seconds  . ENDOMETRIAL ABLATION    . TUBAL LIGATION  12/1988  . WISDOM TOOTH EXTRACTION Bilateral 1996    OB History    Gravida Para Term Preterm AB Living   3 3 3     3    SAB TAB Ectopic Multiple Live Births           3       Home Medications    Prior to Admission medications   Medication Sig Start Date End Date Taking? Authorizing Provider  cetirizine (ZYRTEC) 10 MG tablet Take 10 mg by mouth daily.   Yes [provider]  estradiol (VIVELLE-DOT) 0.05 MG/24HR patch PLACE 1 PATCH ONTO THE SKIN TWO TIMES A WEEK. 05/22/17  Yes Salvadore Dom, MD  omeprazole (PRILOSEC OTC) 20 MG tablet Take 20 mg by mouth daily.   Yes [provider]  progesterone (PROMETRIUM) 100 MG capsule Take 1 capsule (100 mg total) by mouth daily. 05/22/17  Yes Salvadore Dom, MD  ciprofloxacin (CIPRO) 500 MG tablet Take 1 tablet (500 mg total) by mouth 2 (two) times daily. 05/07/17   Caren Macadam, MD  meloxicam (MOBIC) 15 MG tablet Take 1 tablet (15 mg total) by mouth daily. 02/27/17   Hyatt, Max T, DPM  metroNIDAZOLE (FLAGYL) 500 MG tablet Take 1 tablet (500 mg total) by mouth 2 (two) times daily. 05/07/17   Caren Macadam, MD    Family History Family History  Problem Relation Age of Onset  . Hypertension Mother   . Hypertension Father   . ADD / ADHD Sister   . Depression Sister   . Diabetes Daughter   .  Diabetes Maternal Grandmother   . Heart disease Maternal Grandmother   . Cancer Maternal Grandfather        throat, prostate  . Hypertension Maternal Grandfather   . Diabetes Paternal Grandmother   . Hypertension Paternal Grandmother   . Arthritis Paternal Grandmother   . ADD / ADHD Sister    Negative family history of MI in first-degree relatives Social History Social History   Tobacco Use  . Smoking status: Current Every Day Smoker    Packs/day: 0.50    Years: 20.00    Pack years: 10.00    Types: Cigarettes  . Smokeless tobacco: Never Used  Substance Use Topics  . Alcohol use: No  . Drug use: No     Allergies   Other and Tape   Review of Systems Review of Systems    Constitutional: Negative.   HENT: Negative.   Respiratory: Negative.   Cardiovascular: Positive for chest pain.  Gastrointestinal: Negative.   Genitourinary:       Amenorrheic  Musculoskeletal: Negative.   Skin: Negative.   Neurological: Positive for light-headedness.  Psychiatric/Behavioral: Negative.   All other systems reviewed and are negative.    Physical Exam Updated Vital Signs BP (!) 125/95 (BP Location: Right Arm)   Pulse 90   Temp 97.7 F (36.5 C) (Oral)   Resp 16   Ht 5\' 6"  (1.676 m)   Wt 89.8 kg (198 lb)   SpO2 99%   BMI 31.96 kg/m   Physical Exam  Constitutional: She appears well-developed and well-nourished.  HENT:  Head: Normocephalic and atraumatic.  Eyes: Conjunctivae are normal. Pupils are equal, round, and reactive to light.  Neck: Neck supple. No tracheal deviation present. No thyromegaly present.  Cardiovascular: Normal rate and regular rhythm.  No murmur heard. Pulmonary/Chest: Effort normal and breath sounds normal.  Abdominal: Soft. Bowel sounds are normal. She exhibits no distension. There is no tenderness.  Musculoskeletal: Normal range of motion. She exhibits no edema or tenderness.  Neurological: She is alert. Coordination normal.  Skin: Skin is warm and dry. No rash noted.  Psychiatric: She has a normal mood and affect.  Nursing note and vitals reviewed.    ED Treatments / Results  Labs (all labs ordered are listed, but only abnormal results are displayed) Labs Reviewed  BASIC METABOLIC PANEL  CBC  D-DIMER, QUANTITATIVE (NOT AT [facility])  I-STAT TROPONIN, ED    EKG  EKG Interpretation None     ED ECG REPORT   Date: 05/25/2017  Rate: 90  Rhythm: normal sinus rhythm  QRS Axis: normal  Intervals: normal  ST/T Wave abnormalities: normal  Conduction Disutrbances:none  Narrative Interpretation:   Old EKG Reviewed: unchanged Unchanged from 10/06/2014 I have personally reviewed the EKG tracing and agree with the computerized  printout as noted.  Chest x-ray viewed by me Radiology Dg Chest 2 View  Result Date: 05/25/2017 CLINICAL DATA:  Chest pain EXAM: CHEST  2 VIEW COMPARISON:  11/30/2016 FINDINGS: Heart and mediastinal contours are within normal limits. No focal opacities or effusions. No acute bony abnormality. IMPRESSION: No active cardiopulmonary disease. Electronically Signed   By: Rolm Baptise M.D.   On: 05/25/2017 14:04   Results for orders placed or performed during the hospital encounter of 16/10/96  Basic metabolic panel  Result Value Ref Range   Sodium 137 135 - 145 mmol/L   Potassium 4.3 3.5 - 5.1 mmol/L   Chloride 106 101 - 111 mmol/L   CO2 22 22 - 32 mmol/L  Glucose, Bld 94 65 - 99 mg/dL   BUN 8 6 - 20 mg/dL   Creatinine, Ser 0.71 0.44 - 1.00 mg/dL   Calcium 9.1 8.9 - 10.3 mg/dL   GFR calc non Af Amer >60 >60 mL/min   GFR calc Af Amer >60 >60 mL/min   Anion gap 9 5 - 15  CBC  Result Value Ref Range   WBC 10.7 (H) 4.0 - 10.5 K/uL   RBC 4.54 3.87 - 5.11 MIL/uL   Hemoglobin 13.8 12.0 - 15.0 g/dL   HCT 41.7 36.0 - 46.0 %   MCV 91.9 78.0 - 100.0 fL   MCH 30.4 26.0 - 34.0 pg   MCHC 33.1 30.0 - 36.0 g/dL   RDW 14.1 11.5 - 15.5 %   Platelets 292 150 - 400 K/uL  D-dimer, quantitative (not at [facility])  Result Value Ref Range   D-Dimer, Quant <0.27 0.00 - 0.50 ug/mL-FEU  I-stat troponin, ED  Result Value Ref Range   Troponin i, poc 0.01 0.00 - 0.08 ng/mL   Comment 3           Dg Chest 2 View  Result Date: 05/25/2017 CLINICAL DATA:  Chest pain EXAM: CHEST  2 VIEW COMPARISON:  11/30/2016 FINDINGS: Heart and mediastinal contours are within normal limits. No focal opacities or effusions. No acute bony abnormality. IMPRESSION: No active cardiopulmonary disease. Electronically Signed   By: Rolm Baptise M.D.   On: 05/25/2017 14:04   Procedures Procedures (including critical care time)  Medications Ordered in ED Medications - No data to display  Chest x-ray reviewed by me Initial Impression  / Assessment and Plan / ED Course  I have reviewed the triage vital signs and the nursing notes.  Pertinent labs & imaging results that were available during my care of the patient were reviewed by me and considered in my medical decision making (see chart for details).    5:30 PM patient asymptomatic   Low pretest clinical suspicion for pulmonary embolism.  Negative d-dimer. Atypical story for acute coronary syndrome.  Heart score equals 2. counseled patient for 5 minutes on smoking cessation Plan follow-up with PMD. Final Clinical Impressions(s) / ED Diagnoses  Diagnoses #1 atypical chest pain #2 tobacco abuse Final diagnoses:  None    ED Discharge Orders    None       Orlie Dakin, MD 05/25/17 1731    Orlie Dakin, MD 05/25/17 1731

## 2017-05-25 NOTE — Discharge Instructions (Signed)
Call Dr. Mannie Stabile tomorrow to let her know that you were seen here today.  She may want to see you in the office.  Schedule appoint with Dr. Mannie Stabile so that she can help you to stop smoking.

## 2017-05-27 ENCOUNTER — Other Ambulatory Visit: Payer: Self-pay

## 2017-05-27 ENCOUNTER — Encounter: Payer: Self-pay | Admitting: Family Medicine

## 2017-05-27 ENCOUNTER — Ambulatory Visit: Payer: 59 | Admitting: Family Medicine

## 2017-05-27 VITALS — BP 150/90 | HR 102 | Temp 97.5°F | Resp 16 | Ht 66.5 in | Wt 204.5 lb

## 2017-05-27 DIAGNOSIS — I10 Essential (primary) hypertension: Secondary | ICD-10-CM

## 2017-05-27 DIAGNOSIS — F339 Major depressive disorder, recurrent, unspecified: Secondary | ICD-10-CM

## 2017-05-27 DIAGNOSIS — Z9109 Other allergy status, other than to drugs and biological substances: Secondary | ICD-10-CM | POA: Diagnosis not present

## 2017-05-27 DIAGNOSIS — F419 Anxiety disorder, unspecified: Secondary | ICD-10-CM

## 2017-05-27 MED ORDER — VENLAFAXINE HCL ER 37.5 MG PO CP24: 37.5000 mg | ORAL_CAPSULE | Freq: Every day | ORAL | 0 refills | Status: DC

## 2017-05-27 MED ORDER — HYDROCHLOROTHIAZIDE 25 MG PO TABS: 25.0000 mg | ORAL_TABLET | Freq: Every day | ORAL | 3 refills | Status: DC

## 2017-05-27 MED ORDER — CETIRIZINE HCL 10 MG PO TABS: 10.0000 mg | ORAL_TABLET | Freq: Every day | ORAL | 3 refills | Status: AC

## 2017-05-27 NOTE — Progress Notes (Signed)
Patient ID: Lorraine Kelly, female    DOB: 05/24/1966, 51 y.o.   MRN: [id number]  Chief Complaint  Patient presents with  . Dizziness  . Hypertension    Allergies Other and Tape  Subjective:   Lorraine Kelly is a 51 y.o. female who presents to [facility] today.  HPI Here for elevated bp and has not been feeling well. Reports that she has been under a lot of stress. 49 year old daugther lives with her b/c she does not have a job. Reports that she is having to pay for everything for her daughter and is financially stressed. Has had family stressors, works shift work, and just feels really stressed. Believes that all of this is elevating her Bp and making her feel down. Went to the ED for CP. Her daughter convinced her to go b/c of her stress/CP/elevated BP. Feels a weight on her b/c she does not like her job and could not face going to work.   Has talked with manager at work. Really would like some different hours at work b/c she cannot function well with the swing shift. Might be able to change lines and would have better hours but would not be able to switch jobs/lines until February. Has hope that she is going to get the change at work and will have better hours.   Lorraine Kelly presents today for evaluation.  She was seen in the emergency department 2 days ago for chest pain and elevated blood pressure.  She reports that she feels like she is at her breaking point.  She reports that she feels tired and worn out and does not know how she is going to make it.  Reports that she has felt depressed, down, and anxious for the past year or so.  She reports she feels like she is always in a state of stress and anxiety.  Planes that her mind races and worries a lot.  Denies any auditory or visual hallucinations.  Denies any suicidal ideation.  Reports that her mood is progressively been getting worse over the past year and she has been trying to make it on her own.  She reports that with her  job she is unable to get a good nights rest.  Works a swing shift at work and fluctuates between day and night shifts.  Reports that she has been treated for anxiety and depression in the past but this is worse than before.  Reports that in the past she felt like she could make it and handle work and everything but with family stressors recently and over the past 6 months that is been very difficult for her.  Reports there is some days where she does not want to get out of bed.  Does not sleep but about 3 hours a night because cannot fall asleep and her mind is racing and thinking.  Feels very tired and worn out during the day.  Reports that approximately 6 months ago her daughter had a miscarriage and subsequently ended up getting divorced/separated from her husband.  Since then her daughter has moved in with her and with her 38-year old granddaughter.  Reports it is been very stressful and a big interruption into her life.  She has been having to pay for her daughter's expenses and helping to take care of her granddaughter.  She reports she is financially stressed due to this.   She reports that she has tried zoloft, prozac, paxil, elavil,  wellbutrin in the past. Reports that has been on xanax. Has not had success with anti-depressants b/c she feels like the made symptoms worse. Reports that she was on zoloft and it worked for 3 months and then it made her anxious. Reports that the others made her feel even more down.  Denies any episodes of mania.  Reports that since she was seen in the emergency department that she has had no subsequent chest pain.  She has been trying to get her anxiety under control.  Has been seen by therapist in the past and has 5 or 6 more visits this year that she can attend and her work will pay for.  She is interested in getting some help for her mood and some counseling on ways to not be a "pushover" and continually say yes to her daughter.  Feels like she needs some days off from  work to help get her life in order and get some rest so that she can clear her head.  Reports that she is unable to take any paid time off from work because she used most of her days up when her daughter had a miscarriage.  She reports she can take the time off from work without paid if she has a note.   In addition, she reports she is concerned about her blood pressure.  She has had it checked on multiple occasions and it is been elevated.  She denies any shortness of breath or swelling in her extremities.  She does smoke.  Reports she is not ready to quit smoking because she is just too stressed in her life at this time.  She does understand it would be good for her health.  She denies any current chest pain.  Denies any palpitations or shortness of breath.  Has had times when she has felt panicky like she could not breathe but that is when her anxiety was acutely worse.   Anxiety  Presents for initial visit. Onset was 6 to 12 months ago. The problem has been gradually worsening. Symptoms include decreased concentration, depressed mood, excessive worry, insomnia, irritability, malaise, nervous/anxious behavior, panic and restlessness. Patient reports no chest pain, compulsions, confusion, dizziness, dry mouth, feeling of choking, muscle tension, nausea, obsessions, palpitations, shortness of breath or suicidal ideas. Symptoms occur constantly. The severity of symptoms is mild. The symptoms are aggravated by family issues and work stress. The quality of sleep is poor. Nighttime awakenings: occasional, one to two.   Risk factors include a major life event. Her past medical history is significant for anxiety/panic attacks and depression. Past treatments include benzodiazephines, counseling (CBT), lifestyle changes and SSRIs. The treatment provided mild relief. Compliance with prior treatments has been good.    Past Medical History:  Diagnosis Date  . Allergy   . Complication of anesthesia    waking up  too soon; severe itching and rash  . Depression   . GERD (gastroesophageal reflux disease)   . Plantar fasciitis   . PONV (postoperative nausea and vomiting)     Past Surgical History:  Procedure Laterality Date  . COLPOSCOPY    . DILITATION & CURRETTAGE/HYSTROSCOPY WITH NOVASURE ABLATION N/A 11/17/2014   Procedure: HYSTEROSCOPY, UTERINE CURETTAGE, ENDOMETRIAL ABLATION USING NOVASURE;  Surgeon: Jonnie Kind, MD;  Location: [facility];  Service: Gynecology;  Laterality: N/A;  Uterine Cavity Length 5.0cm Uterine Cavity Width 4.3cm Power 118 Time 1 minute 18 seconds  . ENDOMETRIAL ABLATION    . TUBAL LIGATION  12/1988  .  WISDOM TOOTH EXTRACTION Bilateral 1996    Family History  Problem Relation Age of Onset  . Hypertension Mother   . Hypertension Father   . ADD / ADHD Sister   . Depression Sister   . Diabetes Daughter   . Diabetes Maternal Grandmother   . Heart disease Maternal Grandmother   . Cancer Maternal Grandfather        throat, prostate  . Hypertension Maternal Grandfather   . Diabetes Paternal Grandmother   . Hypertension Paternal Grandmother   . Arthritis Paternal Grandmother   . ADD / ADHD Sister      Social History   Socioeconomic History  . Marital status: Single    Spouse name: Not on file  . Number of children: Not on file  . Years of education: Not on file  . Highest education level: Not on file  Social Needs  . Financial resource strain: Not on file  . Food insecurity - worry: Not on file  . Food insecurity - inability: Not on file  . Transportation needs - medical: Not on file  . Transportation needs - non-medical: Not on file  Occupational History  . Not on file  Tobacco Use  . Smoking status: Current Every Day Smoker    Packs/day: 0.50    Years: 20.00    Pack years: 10.00    Types: Cigarettes  . Smokeless tobacco: Never Used  Substance and Sexual Activity  . Alcohol use: No  . Drug use: No  . Sexual activity: Yes    Partners: Male     Birth control/protection: Surgical  Other Topics Concern  . Not on file  Social History Narrative   Works at [employer] and [employer]. Grew up in [place], [place]. Single. Has three children.     Review of Systems  Constitutional: Positive for irritability. Negative for chills, diaphoresis, fever and unexpected weight change.  HENT: Negative for trouble swallowing.   Respiratory: Negative for cough, chest tightness, shortness of breath and stridor.   Cardiovascular: Negative for chest pain, palpitations and leg swelling.  Gastrointestinal: Negative for nausea.  Genitourinary: Negative for difficulty urinating, dysuria and urgency.  Musculoskeletal: Negative for arthralgias and gait problem.  Skin: Negative for rash.  Neurological: Negative for dizziness, tremors, seizures and speech difficulty.  Psychiatric/Behavioral: Positive for agitation, decreased concentration, dysphoric mood and sleep disturbance. Negative for confusion, hallucinations, self-injury and suicidal ideas. The patient is nervous/anxious and has insomnia. The patient is not hyperactive.      Objective:   BP (!) 150/90 (BP Location: Left Arm, Patient Position: Sitting, Cuff Size: Normal)   Pulse (!) 102   Temp (!) 97.5 F (36.4 C) (Other (Comment))   Resp 16   Ht 5' 6.5" (1.689 m)   Wt 204 lb 8 oz (92.8 kg)   SpO2 98%   BMI 32.51 kg/m   Physical Exam  Constitutional: She is oriented to person, place, and time. She appears well-developed and well-nourished.  HENT:  Head: Normocephalic and atraumatic.  Eyes: EOM are normal. Pupils are equal, round, and reactive to light.  Neck: Normal range of motion. Neck supple.  Cardiovascular: Normal rate, regular rhythm and normal heart sounds.  Pulmonary/Chest: Effort normal and breath sounds normal.  Abdominal: Soft.  Neurological: She is alert and oriented to person, place, and time.  Psychiatric: Her behavior is normal. Judgment and thought content normal. Her mood appears  anxious. Her affect is blunt. Her speech is delayed. Cognition and memory are normal. She exhibits a depressed  mood. She expresses no homicidal and no suicidal ideation. She expresses no suicidal plans and no homicidal plans.  Pleasant female, well dressed.  Looks very tired.  Mood and affect somewhat blunted and down.  Tearful while talking.  Denies suicidal or homicidal ideations.  Thought process is goal directed.  Vitals reviewed.    Assessment and Plan  1. Anxiety Uncontrolled, worsened by significant life stressors. - venlafaxine XR (EFFEXOR XR) 37.5 MG 24 hr capsule; Take 1 capsule (37.5 mg total) by mouth daily with breakfast.  Dispense: 30 capsule; Refill: 0  2. 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  We discussed SSRI therapy versus S and RI therapy.  At this time will try Effexor 37.5 mg 1 p.o. daily.  She will follow-up with me in 1 week or sooner if needed.  She was counseled concerning possible side effects of this medication.  We will check her blood pressure when she comes back.  She was given a note to be out of work until the 27th when she returns to our office.  She requested this time from work to follow-up with counselor/therapist, arrange meeting with her family to discuss changes that need to happen in the household, and to get some rest without the stress and pressure of shift work.  She is hopeful that her situation will improve.  She is hopeful that she will get the change in position at work that she is hoping for.   Patient understands that she needs to follow this treatment plan.  In addition she understands that this is a note saying that she has a medical reason to be out of work but it is not a note placing her out on disability.   Suicide risks evaluated and documented in note if present or in the area below.  Today we discussed techniques for relaxation and stress  management.  We also discussed behavior management changes and breathing techniques.  We discussed exercise and sleep hygiene.  She is motivated to make some changes and will call and schedule therapist appointment. Patient does not have/denies the following risks: previous suicide attempts, family history of suicide, access to lethal means, history of alcohol or substance abuse disorder, recent loss of a loved one, or severe hopelessness. Patient denies access to firearms or if present will have removed from home/access.   Patient has protective factors of family and community support.  Patient displays problem solving skills.   Patient specifically denies suicide ideation. Patient has access/information to healthcare contacts if situation or mood changes where patient is a risk to self or others or mood becomes unstable.   During the encounter, the patient had good eye contact and firm handshake regarding safety contract and agreement to seek help if mood worsens and not to harm self.   Patient understands the treatment plan and is in agreement. Agrees to keep follow up and call prior or return to clinic if needed.   - venlafaxine XR (EFFEXOR XR) 37.5 MG 24 hr capsule; Take 1 capsule (37.5 mg total) by mouth daily with breakfast.  Dispense: 30 capsule; Refill: 0 Patient counseled in detail regarding the risks of medication. Told to call or return to clinic if develop any worrisome signs or symptoms. Patient voiced understanding.   3. Essential hypertension Smoking cessation was encouraged.  However I do understand the patient is not mentally/psychologically equipped to tackle this problem at this time.  We did discuss  that this would improve blood pressure.  She was also asked to work on her diet.  She was encouraged to try to eat foods that were good for her health.  - hydrochlorothiazide (HYDRODIURIL) 25 MG tablet; Take 1 tablet (25 mg total) by mouth daily.  Dispense: 90 tablet; Refill:  3 Check BMP at follow-up. The patient is asked to make an attempt to improve diet and exercise patterns to aid in medical management of this problem. Lifestyle modifications discussed with patient including a diet emphasizing vegetables, fruits, and whole grains. Limiting intake of sodium to less than 2,400 mg per day.  Recommendations discussed include consuming low-fat dairy products, poultry, fish, legumes, non-tropical vegetable oils, and nuts; and limiting intake of sweets, sugar-sweetened beverages, and red meat. Discussed following a plan such as the Dietary Approaches to Stop Hypertension (DASH) diet. Patient to read up on this diet.   4. Environmental allergies Refilled.  - cetirizine (ZYRTEC) 10 MG tablet; Take 1 tablet (10 mg total) by mouth daily.  Dispense: 90 tablet; Refill: 3  Return in 9 days (on 06/05/2017) for follow up mood/bp. Caren Macadam, MD 05/27/2017

## 2017-06-06 ENCOUNTER — Ambulatory Visit (INDEPENDENT_AMBULATORY_CARE_PROVIDER_SITE_OTHER): Payer: 59 | Admitting: Family Medicine

## 2017-06-06 ENCOUNTER — Encounter: Payer: Self-pay | Admitting: Family Medicine

## 2017-06-06 ENCOUNTER — Other Ambulatory Visit: Payer: Self-pay

## 2017-06-06 VITALS — BP 130/90 | HR 105 | Temp 97.4°F | Resp 16 | Ht 67.0 in | Wt 200.2 lb

## 2017-06-06 DIAGNOSIS — F419 Anxiety disorder, unspecified: Secondary | ICD-10-CM | POA: Diagnosis not present

## 2017-06-06 DIAGNOSIS — I10 Essential (primary) hypertension: Secondary | ICD-10-CM | POA: Diagnosis not present

## 2017-06-06 DIAGNOSIS — F339 Major depressive disorder, recurrent, unspecified: Secondary | ICD-10-CM | POA: Diagnosis not present

## 2017-06-06 DIAGNOSIS — Z720 Tobacco use: Secondary | ICD-10-CM

## 2017-06-06 LAB — BASIC METABOLIC PANEL
BUN: 11 mg/dL (ref 7–25)
CHLORIDE: 97 mmol/L — AB (ref 98–110)
CO2: 31 mmol/L (ref 20–32)
Calcium: 9.1 mg/dL (ref 8.6–10.2)
Creat: 0.8 mg/dL (ref 0.50–1.10)
GLUCOSE: 118 mg/dL (ref 65–139)
Potassium: 3.2 mmol/L — ABNORMAL LOW (ref 3.5–5.3)
Sodium: 138 mmol/L (ref 135–146)

## 2017-06-06 LAB — LIPID PANEL
CHOLESTEROL: 222 mg/dL — AB (ref <200)
HDL: 40 mg/dL — AB (ref >50)
LDL Cholesterol (Calc): 144 mg/dL (calc) — ABNORMAL HIGH
Non-HDL Cholesterol (Calc): 182 mg/dL (calc) — ABNORMAL HIGH (ref <130)
TRIGLYCERIDES: 236 mg/dL — AB (ref <150)
Total CHOL/HDL Ratio: 5.6 (calc) — ABNORMAL HIGH (ref <5.0)

## 2017-06-06 MED ORDER — VENLAFAXINE HCL ER 75 MG PO CP24: 75.0000 mg | ORAL_CAPSULE | Freq: Every day | ORAL | 1 refills | Status: DC

## 2017-06-06 NOTE — Progress Notes (Signed)
Patient ID: Lorraine Kelly, female    DOB: [birth year], 51 y.o.   MRN: [id number]  Chief Complaint  Patient presents with  . Follow-up    Allergies Other and Tape  Subjective:   Lorraine Kelly is a 51 y.o. female who presents to [facility] today.  HPI Here for follow-up for her mood and blood pressure.  Reports that she has been consistently taking Effexor XR 37.5 mg a day.  Reports that she initially had side effects with it but it is better.  Had nausea and felt foggy in her head initially per her report.  Still feels run down and somewhat depressed. Yesterday finally started feeling better. Appetite is ok. Eating several meals a day. Sleeping better. Has been able to get some rest. Sleeping from 9pm, wakes up through the night, but able to sleep a better chunk of time. Wakes up occasionally after 4 am.  Has been able to be off work for the past week.  Has been able to talk with her daughter regarding her behavior and living situation.  Has not been to therapy.  BP has been running better. Energy is a little bit better but still tired. No CP or SOB. No edema in LE. Has cut down on smoking a little bit but not ready to quit at this time. Has not followed up with therapy.  Review of chart reveals patient has not had cholesterol checked in over 5 years.  She is not currently taking aspirin.  Has not been in for a complete physical exam.   Anxiety  Presents for follow-up visit. Symptoms include depressed mood, excessive worry, irritability, nervous/anxious behavior and restlessness. Patient reports no chest pain, decreased concentration, muscle tension, shortness of breath or suicidal ideas. Symptoms occur constantly. The quality of sleep is good. Nighttime awakenings: occasional.   Compliance with medications is 76-100%.   Current Outpatient Medications on File Prior to Visit  Medication Sig Dispense Refill  . cetirizine (ZYRTEC) 10 MG tablet Take 1 tablet (10 mg total) by  mouth daily. 90 tablet 3  . ciprofloxacin (CIPRO) 500 MG tablet Take 1 tablet (500 mg total) by mouth 2 (two) times daily. 14 tablet 0  . estradiol (VIVELLE-DOT) 0.05 MG/24HR patch PLACE 1 PATCH ONTO THE SKIN TWO TIMES A WEEK. 8 patch 0  . hydrochlorothiazide (HYDRODIURIL) 25 MG tablet Take 1 tablet (25 mg total) by mouth daily. 90 tablet 3  . meloxicam (MOBIC) 15 MG tablet Take 1 tablet (15 mg total) by mouth daily. 30 tablet 3  . metroNIDAZOLE (FLAGYL) 500 MG tablet Take 1 tablet (500 mg total) by mouth 2 (two) times daily. 14 tablet 0  . omeprazole (PRILOSEC OTC) 20 MG tablet Take 20 mg by mouth daily.    . progesterone (PROMETRIUM) 100 MG capsule Take 1 capsule (100 mg total) by mouth daily. 30 capsule 0   No current facility-administered medications on file prior to visit.     Past Medical History:  Diagnosis Date  . Allergy   . Complication of anesthesia    waking up too soon; severe itching and rash  . Depression   . GERD (gastroesophageal reflux disease)   . Plantar fasciitis   . PONV (postoperative nausea and vomiting)     Past Surgical History:  Procedure Laterality Date  . COLPOSCOPY    . DILITATION & CURRETTAGE/HYSTROSCOPY WITH NOVASURE ABLATION N/A 11/17/2014   Procedure: HYSTEROSCOPY, UTERINE CURETTAGE, ENDOMETRIAL ABLATION USING NOVASURE;  Surgeon: Jonnie Kind,  MD;  Location: [facility];  Service: Gynecology;  Laterality: N/A;  Uterine Cavity Length 5.0cm Uterine Cavity Width 4.3cm Power 118 Time 1 minute 18 seconds  . ENDOMETRIAL ABLATION    . TUBAL LIGATION  12/1988  . WISDOM TOOTH EXTRACTION Bilateral 1996    Family History  Problem Relation Age of Onset  . Hypertension Mother   . Hypertension Father   . ADD / ADHD Sister   . Depression Sister   . Diabetes Daughter   . Diabetes Maternal Grandmother   . Heart disease Maternal Grandmother   . Cancer Maternal Grandfather        throat, prostate  . Hypertension Maternal Grandfather   . Diabetes Paternal  Grandmother   . Hypertension Paternal Grandmother   . Arthritis Paternal Grandmother   . ADD / ADHD Sister      Social History   Socioeconomic History  . Marital status: Single    Spouse name: None  . Number of children: None  . Years of education: None  . Highest education level: None  Social Needs  . Financial resource strain: None  . Food insecurity - worry: None  . Food insecurity - inability: None  . Transportation needs - medical: None  . Transportation needs - non-medical: None  Occupational History  . None  Tobacco Use  . Smoking status: Current Every Day Smoker    Packs/day: 0.50    Years: 20.00    Pack years: 10.00    Types: Cigarettes  . Smokeless tobacco: Never Used  Substance and Sexual Activity  . Alcohol use: No  . Drug use: No  . Sexual activity: Yes    Partners: Male    Birth control/protection: Surgical  Other Topics Concern  . None  Social History Narrative   Works at [employer] and [employer]. Grew up in [place], [place]. Single. Has three children.     Review of Systems  Constitutional: Positive for fatigue and irritability. Negative for appetite change and unexpected weight change.  Eyes: Negative for visual disturbance.  Respiratory: Negative for cough, chest tightness, shortness of breath and wheezing.   Cardiovascular: Negative for chest pain.  Gastrointestinal: Negative for abdominal pain, constipation, diarrhea and vomiting.  Genitourinary: Negative for dysuria.  Musculoskeletal: Negative for gait problem and myalgias.  Skin: Negative for rash.  Neurological: Negative for speech difficulty, light-headedness and numbness.  Hematological: Negative for adenopathy. Does not bruise/bleed easily.  Psychiatric/Behavioral: Negative for behavioral problems, decreased concentration, dysphoric mood and suicidal ideas. The patient is nervous/anxious.      Objective:   BP 130/90 (BP Location: Left Arm, Patient Position: Sitting, Cuff Size: Normal)    Pulse (!) 105   Temp (!) 97.4 F (36.3 C) (Other (Comment))   Resp 16   Ht 5\' 7"  (1.702 m)   Wt 200 lb 4 oz (90.8 kg)   SpO2 95%   BMI 31.36 kg/m   Physical Exam  Constitutional: She appears well-developed and well-nourished.  Cardiovascular: Normal rate, regular rhythm and normal heart sounds.  Pulmonary/Chest: Effort normal and breath sounds normal.  Skin: Skin is warm and dry.  Psychiatric: Her speech is normal and behavior is normal. Judgment and thought content normal. Her affect is blunt. Her affect is not inappropriate. She expresses no homicidal and no suicidal ideation. She expresses no suicidal plans and no homicidal plans.  Affect congruent with mood.  Thought process is goal-directed without delusions, phobias, obsessions, or compulsions.  Denies suicidal or homicidal ideations.  Vitals reviewed.  Assessment and Plan  1. Anxiety, uncontrolled Uncontrolled, but improved.  At this time will increase Effexor XR to 75 mg, 1 p.o. daily.  Counseled regarding possible side effects of increased dose.  Call with any questions or concerns.  Defers therapy at this time. - venlafaxine XR (EFFEXOR XR) 75 MG 24 hr capsule; Take 1 capsule (75 mg total) by mouth daily with breakfast.  Dispense: 30 capsule; Refill: 1  2. Depression, recurrent ([place]), uncontrolled Suicide risks evaluated and documented in note if present or in the area below. Patient has protective factors of family and community support.  Patient reports that family believes is behaving rationally. Patient displays problem solving skills.   Patient specifically denies suicide ideation. Patient has access/information to healthcare contacts if situation or mood changes where patient is a risk to self or others or mood becomes unstable.   During the encounter, the patient had good eye contact and firm handshake regarding safety contract and agreement to seek help if mood worsens and not to harm self.   Patient  understands the treatment plan and is in agreement. Agrees to keep follow up and call prior or return to clinic if needed.   - venlafaxine XR (EFFEXOR XR) 75 MG 24 hr capsule; Take 1 capsule (75 mg total) by mouth daily with breakfast.  Dispense: 30 capsule; Refill: 1  3. Essential hypertension We will recheck at follow-up.  If blood pressure greater than target will add additional medication.  Will calculate ASCVD risk and aspirin guide at follow-up after obtaining LDL.  Diet exercise and weight loss recommended. Lifestyle modifications discussed with patient including a diet emphasizing vegetables, fruits, and whole grains. Limiting intake of sodium to less than 2,400 mg per day.  Recommendations discussed include consuming low-fat dairy products, poultry, fish, legumes, non-tropical vegetable oils, and nuts; and limiting intake of sweets, sugar-sweetened beverages, and red meat. Discussed following a plan such as the Dietary Approaches to Stop Hypertension (DASH) diet. Patient to read up on this diet.   - Basic metabolic panel - Lipid panel  4. Tobacco abuse The 5 A's Model for treating Tobacco Use and Dependence was used today. I have identified and documented tobacco use status for this patient. I have urged the patient to quit tobacco use. At this time, the patient is unwilling and not ready to attempt to quit. I have provided patient with information regarding risks, cessation techniques, and interventions that might increase future attempts to quit smoking. I will plan on again addressing tobacco dependence at the next visit.   Return in about 4 weeks (around 07/04/2017) for Bp/mood. Caren Macadam, MD 06/06/2017

## 2017-06-06 NOTE — Patient Instructions (Signed)
Cholesterol Cholesterol is a fat. Your body needs a small amount of cholesterol. Cholesterol (plaque) may build up in your blood vessels (arteries). That makes you more likely to have a heart attack or stroke. You cannot feel your cholesterol level. Having a blood test is the only way to find out if your level is high. Keep your test results. Work with your doctor to keep your cholesterol at a good level. What do the results mean?  Total cholesterol is how much cholesterol is in your blood.  LDL is bad cholesterol. This is the type that can build up. Try to have low LDL.  HDL is good cholesterol. It cleans your blood vessels and carries LDL away. Try to have high HDL.  Triglycerides are fat that the body can store or burn for energy. What are good levels of cholesterol?  Total cholesterol below 200.  LDL below 100 is good for people who have health risks. LDL below 70 is good for people who have very high risks.  HDL above 40 is good. It is best to have HDL of 60 or higher.  Triglycerides below 150. How can I lower my cholesterol? Diet Follow your diet program as told by your doctor.  Choose fish, white meat chicken, or turkey that is roasted or baked. Try not to eat red meat, fried foods, sausage, or lunch meats.  Eat lots of fresh fruits and vegetables.  Choose whole grains, beans, pasta, potatoes, and cereals.  Choose olive oil, corn oil, or canola oil. Only use small amounts.  Try not to eat butter, mayonnaise, shortening, or palm kernel oils.  Try not to eat foods with trans fats.  Choose low-fat or nonfat dairy foods. ? Drink skim or nonfat milk. ? Eat low-fat or nonfat yogurt and cheeses. ? Try not to drink whole milk or cream. ? Try not to eat ice cream, egg yolks, or full-fat cheeses.  Healthy desserts include angel food cake, ginger snaps, animal crackers, hard candy, popsicles, and low-fat or nonfat frozen yogurt. Try not to eat pastries, cakes, pies, and  cookies.  Exercise Follow your exercise program as told by your doctor.  Be more active. Try gardening, walking, and taking the stairs.  Ask your doctor about ways that you can be more active.  Medicine  Take over-the-counter and prescription medicines only as told by your doctor. This information is not intended to replace advice given to you by your health care provider. Make sure you discuss any questions you have with your health care provider. Document Released: 08/23/2008 Document Revised: 12/27/2015 Document Reviewed: 12/07/2015 Elsevier Interactive Patient Education  2018 Elsevier Inc.  

## 2017-06-12 ENCOUNTER — Encounter: Payer: Self-pay | Admitting: Family Medicine

## 2017-06-12 ENCOUNTER — Telehealth: Payer: Self-pay | Admitting: Family Medicine

## 2017-06-12 MED ORDER — POTASSIUM CHLORIDE ER 10 MEQ PO CPCR: 10.0000 meq | ORAL_CAPSULE | Freq: Every day | ORAL | 1 refills | Status: DC

## 2017-06-12 NOTE — Telephone Encounter (Signed)
Per DPR, left message on phone.

## 2017-06-12 NOTE — Telephone Encounter (Signed)
Please call patient and advised that her potassium level was low.  Advised her that this is secondary to the blood pressure pill that we started her home.  She will need to start potassium/Micro-K 10 mEq 1 p.o. daily.  Please take vacation each day and we will recheck her labs when she follows up in the next month.  Please advised that I have sent this prescription into [facility].

## 2017-06-24 ENCOUNTER — Other Ambulatory Visit: Payer: Self-pay | Admitting: Obstetrics and Gynecology

## 2017-06-24 NOTE — Telephone Encounter (Signed)
Medication refill request: Vivelle Patch and Prometrium  Last AEX:  02-17-17 Next OV: 07-01-17 Next AEX: not scheduled  Last MMG (if hormonal medication request): 03-03-17 WNL  Refill authorized: please advise

## 2017-07-03 ENCOUNTER — Ambulatory Visit: Payer: Self-pay | Admitting: Obstetrics and Gynecology

## 2017-07-04 ENCOUNTER — Other Ambulatory Visit: Payer: Self-pay

## 2017-07-04 ENCOUNTER — Encounter: Payer: Self-pay | Admitting: Family Medicine

## 2017-07-04 ENCOUNTER — Ambulatory Visit: Payer: 59 | Admitting: Family Medicine

## 2017-07-04 VITALS — BP 120/78 | HR 101 | Temp 98.3°F | Resp 16 | Ht 67.0 in | Wt 202.0 lb

## 2017-07-04 DIAGNOSIS — E782 Mixed hyperlipidemia: Secondary | ICD-10-CM | POA: Diagnosis not present

## 2017-07-04 DIAGNOSIS — F339 Major depressive disorder, recurrent, unspecified: Secondary | ICD-10-CM | POA: Diagnosis not present

## 2017-07-04 DIAGNOSIS — F419 Anxiety disorder, unspecified: Secondary | ICD-10-CM

## 2017-07-04 DIAGNOSIS — I10 Essential (primary) hypertension: Secondary | ICD-10-CM | POA: Diagnosis not present

## 2017-07-04 DIAGNOSIS — Z23 Encounter for immunization: Secondary | ICD-10-CM | POA: Diagnosis not present

## 2017-07-04 DIAGNOSIS — Z720 Tobacco use: Secondary | ICD-10-CM | POA: Diagnosis not present

## 2017-07-04 DIAGNOSIS — T502X5A Adverse effect of carbonic-anhydrase inhibitors, benzothiadiazides and other diuretics, initial encounter: Secondary | ICD-10-CM

## 2017-07-04 DIAGNOSIS — E876 Hypokalemia: Secondary | ICD-10-CM | POA: Diagnosis not present

## 2017-07-04 MED ORDER — ASPIRIN EC 81 MG PO TBEC: 81.0000 mg | DELAYED_RELEASE_TABLET | Freq: Every day | ORAL | Status: DC

## 2017-07-04 NOTE — Patient Instructions (Signed)
Steps to Quit Smoking Smoking tobacco can be bad for your health. It can also affect almost every organ in your body. Smoking puts you and people around you at risk for many serious long-lasting (chronic) diseases. Quitting smoking is hard, but it is one of the best things that you can do for your health. It is never too late to quit. What are the benefits of quitting smoking? When you quit smoking, you lower your risk for getting serious diseases and conditions. They can include:  Lung cancer or lung disease.  Heart disease.  Stroke.  Heart attack.  Not being able to have children (infertility).  Weak bones (osteoporosis) and broken bones (fractures).  If you have coughing, wheezing, and shortness of breath, those symptoms may get better when you quit. You may also get sick less often. If you are pregnant, quitting smoking can help to lower your chances of having a baby of low birth weight. What can I do to help me quit smoking? Talk with your doctor about what can help you quit smoking. Some things you can do (strategies) include:  Quitting smoking totally, instead of slowly cutting back how much you smoke over a period of time.  Going to in-person counseling. You are more likely to quit if you go to many counseling sessions.  Using resources and support systems, such as: ? Online chats with a counselor. ? Phone quitlines. ? Printed self-help materials. ? Support groups or group counseling. ? Text messaging programs. ? Mobile phone apps or applications.  Taking medicines. Some of these medicines may have nicotine in them. If you are pregnant or breastfeeding, do not take any medicines to quit smoking unless your doctor says it is okay. Talk with your doctor about counseling or other things that can help you.  Talk with your doctor about using more than one strategy at the same time, such as taking medicines while you are also going to in-person counseling. This can help make  quitting easier. What things can I do to make it easier to quit? Quitting smoking might feel very hard at first, but there is a lot that you can do to make it easier. Take these steps:  Talk to your family and friends. Ask them to support and encourage you.  Call phone quitlines, reach out to support groups, or work with a counselor.  Ask people who smoke to not smoke around you.  Avoid places that make you want (trigger) to smoke, such as: ? Bars. ? Parties. ? Smoke-break areas at work.  Spend time with people who do not smoke.  Lower the stress in your life. Stress can make you want to smoke. Try these things to help your stress: ? Getting regular exercise. ? Deep-breathing exercises. ? Yoga. ? Meditating. ? Doing a body scan. To do this, close your eyes, focus on one area of your body at a time from head to toe, and notice which parts of your body are tense. Try to relax the muscles in those areas.  Download or buy apps on your mobile phone or tablet that can help you stick to your quit plan. There are many free apps, such as QuitGuide from the CDC (Centers for Disease Control and Prevention). You can find more support from smokefree.gov and other websites.  This information is not intended to replace advice given to you by your health care provider. Make sure you discuss any questions you have with your health care provider. Document Released: 03/23/2009 Document   Revised: 01/23/2016 Document Reviewed: 10/11/2014 Elsevier Interactive Patient Education  2018 Elsevier Inc.  

## 2017-07-04 NOTE — Progress Notes (Signed)
Patient ID: Lorraine Kelly, female    DOB: [birth year], 52 y.o.   MRN: [id number]  Chief Complaint  Patient presents with  . Follow-up    Allergies Other and Tape  Subjective:   Lorraine Kelly is a 52 y.o. female who presents to [facility] today.  HPI Here for follow-up office visit.  Has been taking the Effexor XR 75 mg a day.  Reports that her mood is better.  Is not having any side effects on the medication.  Reports that she does not feel as sad or anxious.  Feels that her mood is improved.  Is not crying.  Appetite is good.  Sleeping a bit better.  Has received some good news related to her work.  She is being considered for a promotion.  This would entail her moving to [place] and relocating.  However she reports that this would be a good change for her.  It has financial benefits and would also help to decrease her stress associated with her living situation here. She has been taking the HCTZ 25 mg a day.  Denies any side effects with the medication.  Has also been taking the potassium 1 pill a day.  No muscle cramps or pain in her legs.  No headaches.  Denies any palpitations or shortness of breath. Had her cholesterol checked at the last visit and is here to discuss.  Reports that she had not been eating a healthy diet and now is more motivated to improve her diet.  Reports that she is still smoking but not yet ready to quit.  She would consider cutting down on smoking.  She smokes over half a pack per day.  Stress is 1 of the triggers that causes her to smoke more.    Past Medical History:  Diagnosis Date  . Allergy   . Complication of anesthesia    waking up too soon; severe itching and rash  . Depression   . GERD (gastroesophageal reflux disease)   . Plantar fasciitis   . PONV (postoperative nausea and vomiting)     Past Surgical History:  Procedure Laterality Date  . COLPOSCOPY    . DILITATION & CURRETTAGE/HYSTROSCOPY WITH NOVASURE ABLATION N/A  11/17/2014   Procedure: HYSTEROSCOPY, UTERINE CURETTAGE, ENDOMETRIAL ABLATION USING NOVASURE;  Surgeon: Jonnie Kind, MD;  Location: [facility];  Service: Gynecology;  Laterality: N/A;  Uterine Cavity Length 5.0cm Uterine Cavity Width 4.3cm Power 118 Time 1 minute 18 seconds  . ENDOMETRIAL ABLATION    . TUBAL LIGATION  12/1988  . WISDOM TOOTH EXTRACTION Bilateral 1996    Family History  Problem Relation Age of Onset  . Hypertension Mother   . Hypertension Father   . ADD / ADHD Sister   . Depression Sister   . Diabetes Daughter   . Diabetes Maternal Grandmother   . Heart disease Maternal Grandmother   . Cancer Maternal Grandfather        throat, prostate  . Hypertension Maternal Grandfather   . Diabetes Paternal Grandmother   . Hypertension Paternal Grandmother   . Arthritis Paternal Grandmother   . ADD / ADHD Sister      Social History   Socioeconomic History  . Marital status: Single    Spouse name: None  . Number of children: None  . Years of education: None  . Highest education level: None  Social Needs  . Financial resource strain: None  . Food insecurity - worry: None  .  Food insecurity - inability: None  . Transportation needs - medical: None  . Transportation needs - non-medical: None  Occupational History  . None  Tobacco Use  . Smoking status: Current Every Day Smoker    Packs/day: 0.50    Years: 20.00    Pack years: 10.00    Types: Cigarettes  . Smokeless tobacco: Never Used  Substance and Sexual Activity  . Alcohol use: No  . Drug use: No  . Sexual activity: Yes    Partners: Male    Birth control/protection: Surgical  Other Topics Concern  . None  Social History Narrative   Works at [employer] and [employer]. Grew up in [place], [place]. Single. Has three children.     Review of Systems  Constitutional: Negative for activity change, diaphoresis, fatigue and fever.  Eyes: Negative for visual disturbance.  Respiratory: Negative for cough and chest  tightness.   Cardiovascular: Negative for chest pain and palpitations.  Gastrointestinal: Negative for abdominal pain, diarrhea and nausea.  Musculoskeletal: Negative for arthralgias and myalgias.  Skin: Negative for rash.  Neurological: Negative for dizziness, syncope, facial asymmetry, weakness, numbness and headaches.  Hematological: Negative for adenopathy. Does not bruise/bleed easily.  Psychiatric/Behavioral: Negative for agitation, decreased concentration, dysphoric mood, hallucinations, self-injury, sleep disturbance and suicidal ideas. The patient is not nervous/anxious.     Current Outpatient Medications on File Prior to Visit  Medication Sig Dispense Refill  . cetirizine (ZYRTEC) 10 MG tablet Take 1 tablet (10 mg total) by mouth daily. 90 tablet 3  . estradiol (VIVELLE-DOT) 0.05 MG/24HR patch PLACE 1 PATCH ONTO THE SKIN TWO TIMES A WEEK. 8 patch 0  . hydrochlorothiazide (HYDRODIURIL) 25 MG tablet Take 1 tablet (25 mg total) by mouth daily. 90 tablet 3  . omeprazole (PRILOSEC OTC) 20 MG tablet Take 20 mg by mouth daily.    . potassium chloride (MICRO-K) 10 MEQ CR capsule Take 1 capsule (10 mEq total) by mouth daily. 30 capsule 1  . progesterone (PROMETRIUM) 100 MG capsule TAKE 1 CAPSULE BY MOUTH DAILY. 30 capsule 0  . venlafaxine XR (EFFEXOR XR) 75 MG 24 hr capsule Take 1 capsule (75 mg total) by mouth daily with breakfast. 30 capsule 1  . ciprofloxacin (CIPRO) 500 MG tablet Take 1 tablet (500 mg total) by mouth 2 (two) times daily. (Patient not taking: Reported on 07/04/2017) 14 tablet 0  . metroNIDAZOLE (FLAGYL) 500 MG tablet Take 1 tablet (500 mg total) by mouth 2 (two) times daily. (Patient not taking: Reported on 07/04/2017) 14 tablet 0   No current facility-administered medications on file prior to visit.     Objective:   BP 120/78 (BP Location: Left Arm, Patient Position: Sitting, Cuff Size: Normal)   Pulse (!) 101   Temp 98.3 F (36.8 C) (Temporal)   Resp 16   Ht 5\' 7"   (1.702 m)   Wt 202 lb (91.6 kg)   SpO2 98%   BMI 31.64 kg/m   Physical Exam  Constitutional: She is oriented to person, place, and time. She appears well-developed and well-nourished.  HENT:  Head: Normocephalic and atraumatic.  Eyes: EOM are normal. Pupils are equal, round, and reactive to light.  Neck: Normal range of motion. Neck supple.  Cardiovascular: Normal rate, regular rhythm and normal heart sounds.  Pulmonary/Chest: Effort normal and breath sounds normal.  Neurological: She is alert and oriented to person, place, and time.  Skin: Skin is warm and dry.  Psychiatric: She has a normal mood and affect. Her behavior  is normal. Judgment and thought content normal.  Vitals reviewed.  Cholesterol <200 mg/dL 222 Abnormally high    HDL >50 mg/dL 40 Abnormally low    Triglycerides <150 mg/dL 236 Abnormally high    LDL Cholesterol (Calc) mg/dL (calc) 144 Abnormally high       Assessment and Plan  1. Depression, recurrent ([facility]) Mood improved on Effexor XR 75 mg p.o. daily.  No side effects.  Doing well.  We will continue medication at this time without escalation in dose.  Patient denies any suicidal or homicidal ideations. Patient counseled in detail regarding the risks of medication. Told to call or return to clinic if develop any worrisome signs or symptoms. Patient voiced understanding.   2. HTN, goal below 140/90 Stable and improved on HCTZ.  Patient is currently on Micro-K 10 mEq p.o. daily.  Continue diet, exercise and weight loss.  Smoking cessation again discussed.  She will try to cut down on her smoking to 1/2 pack/day by her next visit. Secondary to cardiac risk factors.  Recommend starting low-dose aspirin therapy.  Risk versus benefits discussed. - aspirin EC 81 MG tablet; Take 1 tablet (81 mg total) by mouth daily.  3. Anxiety Stable.  Improved.  Continue medication as directed. Exercise and relaxation techniques recommended.  4. Diuretic-induced  hypokalemia Check BMP today and will increase potassium supplementation if needed. - Basic metabolic panel  5. Mixed hyperlipidemia Calculated patient's ASCVD risk.  Her risk is less than 7%.  She does not wish to start statin medication at this time.  She would like to institute diet and exercise modifications to improve her cholesterol.  We did discuss Mediterranean diet and low-cholesterol food choices today.  She will try to keep a dietary record and bring to her follow-up. Hyperlipidemia and the associated risk of ASCVD were discussed today. Primary vs. Secondary prevention of ASCVD were discussed and how it relates to patient morbidity, mortality, and quality of life. Shared decision making with patient including the risks of statins vs.benefits of ASCVD risk reduction discussed. We discussed heart healthy diet, lifestyle modifications, risk factor modifications, and adherence to the recommended treatment plan.  We discussed that we will plan on rechecking cholesterol in 3-6 months and if not improved would consider starting low-dose statin therapy. 6. Immunization due - Flu Vaccine QUAD 6+ mos PF IM (Fluarix Quad PF)  7.  Tobacco abuse The 5 A's Model for treating Tobacco Use and Dependence was used today. I have identified and documented tobacco use status for this patient. I have urged the patient to quit tobacco use. At this time, the patient is unwilling and not ready to attempt to quit.  However, she is willing to cut down on her smoking.  We have agreed that she would decrease her smoking intake to 1/2 pack/day or less by the time she comes back in to the office visit.  She feels this is a goal that she can meet.  I have provided patient with information regarding risks, cessation techniques, and interventions that might increase future attempts to quit smoking. I will plan on again addressing tobacco dependence at the next visit.  Return in about 2 months (around 09/01/2017) for follow  up. Caren Macadam, MD 07/04/2017

## 2017-07-08 ENCOUNTER — Encounter: Payer: Self-pay | Admitting: Obstetrics and Gynecology

## 2017-07-08 ENCOUNTER — Telehealth: Payer: Self-pay | Admitting: Obstetrics and Gynecology

## 2017-07-08 ENCOUNTER — Ambulatory Visit: Payer: Self-pay | Admitting: Obstetrics and Gynecology

## 2017-07-08 NOTE — Telephone Encounter (Signed)
Patient dnka her follow up HRT appointment today,. I left her a message to call and reschedule. FYI: Patient did confirm this appointment yesterday. [facility] policy followed.

## 2017-07-18 ENCOUNTER — Other Ambulatory Visit: Payer: Self-pay | Admitting: Family Medicine

## 2017-07-18 DIAGNOSIS — F339 Major depressive disorder, recurrent, unspecified: Secondary | ICD-10-CM

## 2017-07-18 DIAGNOSIS — F419 Anxiety disorder, unspecified: Secondary | ICD-10-CM

## 2017-07-25 ENCOUNTER — Telehealth: Payer: Self-pay | Admitting: Family Medicine

## 2017-07-25 NOTE — Telephone Encounter (Signed)
Patient called in requesting an appt because she fell last night. She says she does not have an orthopedic dr. No more available appts today. Can this wait til next availability 8am tue 07/28/17  Cb#: [phone]

## 2017-07-25 NOTE — Telephone Encounter (Signed)
Called patient regarding message below. No answer, unable to leave message.  I called her to ask what she hit when she fell, if there was any bruising, bleeding, etc, and to ask for 0-10 pain level.

## 2017-07-29 NOTE — Telephone Encounter (Signed)
Called patient regarding message below. No answer, unable to leave message.  

## 2017-08-05 ENCOUNTER — Other Ambulatory Visit: Payer: Self-pay | Admitting: Obstetrics and Gynecology

## 2017-08-05 NOTE — Telephone Encounter (Signed)
Medication refill request: Progesterone & Vivelle Patch  Last AEX:  02-17-17  Next AEX: not yet scheduled  Last MMG (if hormonal medication request): 03-03-17 WNL  Refill authorized: please advise

## 2017-08-06 NOTE — Telephone Encounter (Signed)
The patient is overdue for f/u. Please schedule her for f/u HRT appointment. One month of HRT sent

## 2017-09-02 ENCOUNTER — Other Ambulatory Visit: Payer: Self-pay

## 2017-09-02 ENCOUNTER — Encounter: Payer: Self-pay | Admitting: Family Medicine

## 2017-09-02 ENCOUNTER — Ambulatory Visit: Payer: 59 | Admitting: Family Medicine

## 2017-09-02 VITALS — BP 130/84 | HR 88 | Temp 97.8°F | Resp 16 | Ht 67.0 in | Wt 204.8 lb

## 2017-09-02 DIAGNOSIS — T502X5A Adverse effect of carbonic-anhydrase inhibitors, benzothiadiazides and other diuretics, initial encounter: Secondary | ICD-10-CM | POA: Diagnosis not present

## 2017-09-02 DIAGNOSIS — F419 Anxiety disorder, unspecified: Secondary | ICD-10-CM | POA: Diagnosis not present

## 2017-09-02 DIAGNOSIS — F329 Major depressive disorder, single episode, unspecified: Secondary | ICD-10-CM | POA: Diagnosis not present

## 2017-09-02 DIAGNOSIS — R1013 Epigastric pain: Secondary | ICD-10-CM

## 2017-09-02 DIAGNOSIS — Z9229 Personal history of other drug therapy: Secondary | ICD-10-CM | POA: Diagnosis not present

## 2017-09-02 DIAGNOSIS — E782 Mixed hyperlipidemia: Secondary | ICD-10-CM

## 2017-09-02 DIAGNOSIS — I10 Essential (primary) hypertension: Secondary | ICD-10-CM | POA: Insufficient documentation

## 2017-09-02 DIAGNOSIS — E876 Hypokalemia: Secondary | ICD-10-CM | POA: Diagnosis not present

## 2017-09-02 DIAGNOSIS — F32A Depression, unspecified: Secondary | ICD-10-CM | POA: Insufficient documentation

## 2017-09-02 LAB — BASIC METABOLIC PANEL
BUN: 9 mg/dL (ref 7–25)
CALCIUM: 9 mg/dL (ref 8.6–10.2)
CHLORIDE: 102 mmol/L (ref 98–110)
CO2: 28 mmol/L (ref 20–32)
Creat: 0.82 mg/dL (ref 0.50–1.10)
Glucose, Bld: 93 mg/dL (ref 65–139)
POTASSIUM: 3.5 mmol/L (ref 3.5–5.3)
Sodium: 138 mmol/L (ref 135–146)

## 2017-09-02 MED ORDER — VENLAFAXINE HCL ER 75 MG PO CP24: 75.0000 mg | ORAL_CAPSULE | Freq: Every day | ORAL | 1 refills | Status: DC

## 2017-09-02 MED ORDER — PROGESTERONE MICRONIZED 100 MG PO CAPS: 100.0000 mg | ORAL_CAPSULE | Freq: Every day | ORAL | 0 refills | Status: DC

## 2017-09-02 MED ORDER — ESTRADIOL 0.05 MG/24HR TD PTTW: 1.0000 | MEDICATED_PATCH | TRANSDERMAL | 0 refills | Status: DC

## 2017-09-02 MED ORDER — PANTOPRAZOLE SODIUM 40 MG PO TBEC: 40.0000 mg | DELAYED_RELEASE_TABLET | Freq: Every day | ORAL | 3 refills | Status: DC

## 2017-09-02 NOTE — Patient Instructions (Signed)
Come back to the lab and get your cholesterol checked before your next visit.

## 2017-09-02 NOTE — Progress Notes (Signed)
Patient ID: Lorraine Kelly DOBRATZ, female    DOB: [birth year], 52 y.o.   MRN: [id number]  Chief Complaint  Patient presents with  . Follow-up    Allergies Other and Tape  Subjective:   Lorraine Kelly is a 52 y.o. female who presents to [facility] today.  HPI Here for follow up. Reports that she has decided not to take the job in [place]. Staying here in [place] and going to be keeping same job. Is trying to get a better job at the same place. Working the "swing" shift at work and it is hard to deal with the hours and not having a sleep schedule.   Has been taking medication for mood. Effexor XR 75 mg a day with no problems. Is not crying or feeling sad. Mood is not down. Sleeping well. Feels better and thinks clearer on medication. Has been getting things straight with daughter and daughter is taking more responsibility about her life.   Reports that she would like to get a refill on her hormone replacement therapy.  Has been on the medication for several years after being placed on this by her OB/GYN.  Has not been able to go back to her OB/GYN due to her work schedule.  She reports that she has not had any problems with her contraception.  She does still smoke.  She has no history of blood clots.  She has been getting her mammograms which are negative.  She denies any lumps or lesions in her breast.  She denies any vaginal discharge or vaginal bleeding.  She denies any side effects with the hormone replacement therapy.  She reports that she has to stay on the medicine for her quality of life.  She reports that if she does not take the hormone replacement that she has vaginal dryness, hot flashes, flushing, inability to sleep, and struggles with her mood.  She would like to remain on this medication.  She also has a history of dyspepsia.  She reports that if she does not take the Prilosec at least once a day that she gets terrible burning and heartburn.  She reports some days she has to  take it twice a day.  She has never had an endoscopy performed.  She has had a colonoscopy performed at [facility] which was negative.  She has been on the PPI for an extended amount of time with no screener evaluation of her esophagus.  She does smoke cigarettes.  She does report that she went to get her potassium checked after the last visit but did not have it drawn due to the fact that the line was so long.  She reports that she would like to get that done today.  She is still been taking the potassium once a day.  She reports her leg cramps are much improved.  Hypertension  This is a chronic problem. The current episode started more than 1 year ago. The problem has been waxing and waning since onset. The problem is controlled. Pertinent negatives include no anxiety, blurred vision, chest pain, headaches, malaise/fatigue, peripheral edema, PND or shortness of breath. There are no associated agents to hypertension. Risk factors for coronary artery disease include family history, dyslipidemia and stress. Past treatments include diuretics and lifestyle changes. The current treatment provides significant improvement. There are no compliance problems.  There is no history of angina, kidney disease, CAD/MI, CVA, heart failure, left ventricular hypertrophy, PVD or retinopathy.  Depression         This is a chronic problem.  The current episode started more than 1 month ago.   The onset quality is gradual.   The problem occurs daily.  The problem has been gradually improving since onset.  Associated symptoms include no decreased concentration, no fatigue, no helplessness, no hopelessness, not irritable, no restlessness, no decreased interest, no appetite change, no body aches, no headaches and not sad.     The symptoms are aggravated by nothing.  Past treatments include SSRIs - Selective serotonin reuptake inhibitors and SNRIs - Serotonin and norepinephrine reuptake inhibitors.   Compliance with treatment is good.  Previous treatment provided significant relief.   Pertinent negatives include no anxiety.   Past Medical History:  Diagnosis Date  . Allergy   . Complication of anesthesia    waking up too soon; severe itching and rash  . Depression   . GERD (gastroesophageal reflux disease)   . Plantar fasciitis   . PONV (postoperative nausea and vomiting)     Past Surgical History:  Procedure Laterality Date  . COLPOSCOPY    . DILITATION & CURRETTAGE/HYSTROSCOPY WITH NOVASURE ABLATION N/A 11/17/2014   Procedure: HYSTEROSCOPY, UTERINE CURETTAGE, ENDOMETRIAL ABLATION USING NOVASURE;  Surgeon: Jonnie Kind, MD;  Location: [facility];  Service: Gynecology;  Laterality: N/A;  Uterine Cavity Length 5.0cm Uterine Cavity Width 4.3cm Power 118 Time 1 minute 18 seconds  . ENDOMETRIAL ABLATION    . TUBAL LIGATION  12/1988  . WISDOM TOOTH EXTRACTION Bilateral 1996    Family History  Problem Relation Age of Onset  . Hypertension Mother   . Hypertension Father   . ADD / ADHD Sister   . Depression Sister   . Diabetes Daughter   . Diabetes Maternal Grandmother   . Heart disease Maternal Grandmother   . Cancer Maternal Grandfather        throat, prostate  . Hypertension Maternal Grandfather   . Diabetes Paternal Grandmother   . Hypertension Paternal Grandmother   . Arthritis Paternal Grandmother   . ADD / ADHD Sister      Social History   Socioeconomic History  . Marital status: Single    Spouse name: Not on file  . Number of children: Not on file  . Years of education: Not on file  . Highest education level: Not on file  Occupational History  . Not on file  Social Needs  . Financial resource strain: Not on file  . Food insecurity:    Worry: Not on file    Inability: Not on file  . Transportation needs:    Medical: Not on file    Non-medical: Not on file  Tobacco Use  . Smoking status: Current Every Day Smoker    Packs/day: 0.50    Years: 20.00     Pack years: 10.00    Types: Cigarettes  . Smokeless tobacco: Never Used  Substance and Sexual Activity  . Alcohol use: No  . Drug use: No  . Sexual activity: Yes    Partners: Male    Birth control/protection: Surgical  Lifestyle  . Physical activity:    Days per week: Not on file    Minutes per session: Not on file  . Stress: Not on file  Relationships  . Social connections:    Talks on phone: Not on file    Gets together: Not on file    Attends religious service: Not on file    Active member of club  or organization: Not on file    Attends meetings of clubs or organizations: Not on file    Relationship status: Not on file  Other Topics Concern  . Not on file  Social History Narrative   Works at [employer] and [employer]. Grew up in [place], [place]. Single. Has three children.    Current Outpatient Medications on File Prior to Visit  Medication Sig Dispense Refill  . aspirin EC 81 MG tablet Take 1 tablet (81 mg total) by mouth daily.    . cetirizine (ZYRTEC) 10 MG tablet Take 1 tablet (10 mg total) by mouth daily. 90 tablet 3  . hydrochlorothiazide (HYDRODIURIL) 25 MG tablet Take 1 tablet (25 mg total) by mouth daily. 90 tablet 3  . omeprazole (PRILOSEC OTC) 20 MG tablet Take 20 mg by mouth daily.    . potassium chloride (K-DUR) 10 MEQ tablet TAKE 1 TABLET BY MOUTH ONCE A DAY. 30 tablet 0  . progesterone (PROMETRIUM) 100 MG capsule TAKE 1 CAPSULE BY MOUTH DAILY. 30 capsule 0  . venlafaxine XR (EFFEXOR-XR) 75 MG 24 hr capsule TAKE 1 CAPSULE BY MOUTH ONCE DAILY WITH BREAKFAST. 30 capsule 0  . VIVELLE-DOT 0.05 MG/24HR patch PLACE 1 PATCH ONTO THE SKIN TWO TIMES A WEEK. 8 patch 0   No current facility-administered medications on file prior to visit.     Review of Systems  Constitutional: Negative for appetite change, fatigue and malaise/fatigue.  Eyes: Negative for blurred vision.  Respiratory: Negative for shortness of breath.   Cardiovascular: Negative for chest pain and PND.    Neurological: Negative for headaches.  Psychiatric/Behavioral: Positive for depression. Negative for decreased concentration.    Current Outpatient Medications on File Prior to Visit  Medication Sig Dispense Refill  . aspirin EC 81 MG tablet Take 1 tablet (81 mg total) by mouth daily.    . cetirizine (ZYRTEC) 10 MG tablet Take 1 tablet (10 mg total) by mouth daily. 90 tablet 3  . hydrochlorothiazide (HYDRODIURIL) 25 MG tablet Take 1 tablet (25 mg total) by mouth daily. 90 tablet 3  . omeprazole (PRILOSEC OTC) 20 MG tablet Take 20 mg by mouth daily.    . potassium chloride (K-DUR) 10 MEQ tablet TAKE 1 TABLET BY MOUTH ONCE A DAY. 30 tablet 0  . progesterone (PROMETRIUM) 100 MG capsule TAKE 1 CAPSULE BY MOUTH DAILY. 30 capsule 0  . venlafaxine XR (EFFEXOR-XR) 75 MG 24 hr capsule TAKE 1 CAPSULE BY MOUTH ONCE DAILY WITH BREAKFAST. 30 capsule 0  . VIVELLE-DOT 0.05 MG/24HR patch PLACE 1 PATCH ONTO THE SKIN TWO TIMES A WEEK. 8 patch 0   No current facility-administered medications on file prior to visit.     Objective:   BP 130/84 (BP Location: Left Arm, Patient Position: Sitting, Cuff Size: Normal)   Pulse 88   Temp 97.8 F (36.6 C) (Temporal)   Resp 16   Ht 5\' 7"  (1.702 m)   Wt 204 lb 12 oz (92.9 kg)   SpO2 96%   BMI 32.07 kg/m   Physical Exam  Constitutional: She is oriented to person, place, and time. She appears well-developed and well-nourished. She is not irritable. No distress.  HENT:  Head: Normocephalic and atraumatic.  Eyes: Pupils are equal, round, and reactive to light.  Neck: Normal range of motion. Neck supple. No thyromegaly present.  Cardiovascular: Normal rate, regular rhythm and normal heart sounds.  Pulmonary/Chest: Effort normal and breath sounds normal. No respiratory distress.  Neurological: She is alert and oriented to  person, place, and time. No cranial nerve deficit.  Skin: Skin is warm and dry.  Psychiatric: She has a normal mood and affect. Her behavior  is normal. Judgment and thought content normal. Cognition and memory are normal. She expresses no homicidal and no suicidal ideation. She expresses no suicidal plans and no homicidal plans.  Nursing note and vitals reviewed.    Assessment and Plan  1. Anxiety and depression Improved on Effexor XR. Continue as directed. Patient counseled in detail regarding the risks of medication. Told to call or return to clinic if develop any worrisome signs or symptoms. Patient voiced understanding.  Suicide risks evaluated and documented in note if present or in the area below.  Patient has protective factors of family and community support.  Patient reports that family believes is behaving rationally. Patient displays problem solving skills.   Patient specifically denies suicide ideation. Patient has access/information to healthcare contacts if situation or mood changes where patient is a risk to self or others or mood becomes unstable.   During the encounter, the patient had good eye contact and firm handshake regarding safety contract and agreement to seek help if mood worsens and not to harm self.   Patient understands the treatment plan and is in agreement. Agrees to keep follow up and call prior or return to clinic if needed.    2. HTN, goal below 140/90 Stable. Lifestyle modifications discussed with patient including a diet emphasizing vegetables, fruits, and whole grains. Limiting intake of sodium to less than 2,400 mg per day.  Recommendations discussed include consuming low-fat dairy products, poultry, fish, legumes, non-tropical vegetable oils, and nuts; and limiting intake of sweets, sugar-sweetened beverages, and red meat. Discussed following a plan such as the Dietary Approaches to Stop Hypertension (DASH) diet. Patient to read up on this diet.  Continue HCTZ. Check K.  Start ASA 81 mg a day.  Defers statin at this time. Agrees to recheck in 3 months and if not better then will start  statin.   Tobacco cessation discussed. The 5 A's Model for treating Tobacco Use and Dependence was used today. I have identified and documented tobacco use status for this patient. I have urged the patient to quit tobacco use. At this time, the patient is unwilling and not ready to attempt to quit. I have provided patient with information regarding risks, cessation techniques, and interventions that might increase future attempts to quit smoking. I will plan on again addressing tobacco dependence at the next visit.  3. Dyspepsia Patient with chronic dyspepsia for extended period of time.  Unable to wean off of PPI and needing escalating dose of medication to control her symptoms.  She reports her insurance will no longer pay for the Prilosec and is too expensive.  She requests change of medication.  She is in agreement to see GI for probable upper endoscopy.  Referral was placed at this time.  Reflux precautions given.  Tobacco cessation recommended. - pantoprazole (PROTONIX) 40 MG tablet; Take 1 tablet (40 mg total) by mouth daily.  Dispense: 30 tablet; Refill: 3 - Ambulatory referral to Gastroenterology  4. History of postmenopausal HRT Discussed with patient that at this time I will refill this medication.  However we did discuss if she has any vaginal bleeding, worrisome signs or symptoms on the hormone replacement therapy or problems that she would most likely need to go back and see her gynecologist.  She voiced understanding of this.  She will keep her scheduled visits for  Pap, pelvic, and clinical breast exam.  She is up-to-date with her mammogram.  She understands the risks versus benefits of hormone replacement therapy.  She understands the risks of malignancy, increased blood pressure, increased risk of heart attack and stroke.  She also understands the increased risk of blood clots.  We also discussed and she verbalized understanding that her risks are increased for all these conditions with  tobacco use. - progesterone (PROMETRIUM) 100 MG capsule; Take 1 capsule (100 mg total) by mouth daily.  Dispense: 90 capsule; Refill: 0 - estradiol (VIVELLE-DOT) 0.05 MG/24HR patch; Place 1 patch (0.05 mg total) onto the skin 2 (two) times a week.  Dispense: 24 patch; Refill: 0  5. Hyperlipidemia Patient defers cholesterol medication at this time.  She would like to return to clinic in 3 months for recheck.  She will continue medication at that time for LDL is not improved.  We did discuss the correlation between high cholesterol, hypertension, tobacco use and risk of heart attack and stroke.  She voiced understanding.  Order placed for her to get her cholesterol checked before her next visit.  Patient with a BMI of 32.  Obesity discussed today.  Diet, exercise, and weight loss modifications recommended. Return in about 3 months (around 12/03/2017). Caren Macadam, MD 09/02/2017

## 2017-09-03 ENCOUNTER — Telehealth: Payer: Self-pay | Admitting: General Practice

## 2017-09-03 NOTE — Telephone Encounter (Signed)
Erline Levine please schedule the patient to be seen as a new patient here in our first available appointment.   Routing to [place].

## 2017-09-04 ENCOUNTER — Encounter: Payer: Self-pay | Admitting: Gastroenterology

## 2017-09-05 ENCOUNTER — Telehealth: Payer: Self-pay | Admitting: Family Medicine

## 2017-09-05 DIAGNOSIS — E876 Hypokalemia: Secondary | ICD-10-CM

## 2017-09-05 MED ORDER — POTASSIUM CHLORIDE ER 10 MEQ PO TBCR: 20.0000 meq | EXTENDED_RELEASE_TABLET | Freq: Every day | ORAL | 1 refills | Status: DC

## 2017-09-05 NOTE — Telephone Encounter (Signed)
Please call and advise patient that her potassium is still not in the recommended range.  It is better from last time.  She is 3.5 and she was 3.2.  I would like for her to increase the potassium to 2 pills a day.  She can either do it twice a day at the same time.  She should return to the lab in 1 month for potassium recheck.  I will go ahead and order this rechecked now.  I have sent the medication to the pharmacy.

## 2017-09-08 NOTE — Telephone Encounter (Signed)
Called patient regarding message below. No answer, left generic message for patient to return call.   

## 2017-09-09 NOTE — Telephone Encounter (Signed)
Patient informed of message below, verbalized understanding.  

## 2017-10-10 ENCOUNTER — Encounter: Payer: Self-pay | Admitting: Gastroenterology

## 2017-10-10 ENCOUNTER — Ambulatory Visit: Payer: 59 | Admitting: Gastroenterology

## 2017-10-10 ENCOUNTER — Other Ambulatory Visit: Payer: Self-pay

## 2017-10-10 VITALS — BP 120/80 | HR 80 | Temp 97.0°F | Ht 66.5 in | Wt 202.4 lb

## 2017-10-10 DIAGNOSIS — R140 Abdominal distension (gaseous): Secondary | ICD-10-CM

## 2017-10-10 DIAGNOSIS — R1310 Dysphagia, unspecified: Secondary | ICD-10-CM

## 2017-10-10 DIAGNOSIS — R197 Diarrhea, unspecified: Secondary | ICD-10-CM | POA: Diagnosis not present

## 2017-10-10 DIAGNOSIS — K219 Gastro-esophageal reflux disease without esophagitis: Secondary | ICD-10-CM | POA: Diagnosis not present

## 2017-10-10 DIAGNOSIS — R1319 Other dysphagia: Secondary | ICD-10-CM

## 2017-10-10 MED ORDER — DEXLANSOPRAZOLE 60 MG PO CPDR: 60.0000 mg | DELAYED_RELEASE_CAPSULE | Freq: Every day | ORAL | 11 refills | Status: DC

## 2017-10-10 NOTE — Patient Instructions (Signed)
PA info for EGD/DIL submitted via [facility] website. Case approved. PA# [id number].

## 2017-10-10 NOTE — Progress Notes (Addendum)
Primary Care Physician:  Caren Macadam, MD  Primary Gastroenterologist:  Barney Drain, MD REVIEWED-NO ADDITIONAL RECOMMENDATIONS.  Chief Complaint  Patient presents with  . Gastroesophageal Reflux    c/o burning, nausea    HPI:  Lorraine Kelly is a 52 y.o. female here at the request of Dr. Mannie Stabile for further evaluation of GERD.  Patient tells me she is had GI issues for many years.  Last year she developed several week period of time where she had diarrhea for several weeks, no solid stool, symptoms associated with new abdominal pain.  Pain was severe.  Mostly right-sided.  She was hospitalized at this time, June 2018.  CT abdomen pelvis with contrast showed subtle inflammation about the lower descending colon suspicious for acute diverticulitis.  Distal appendix was mildly prominent measuring 8 mm, questionable appendiceal stranding.  She was evaluated by surgery who suspected diverticulitis rather than appendicitis.  He was treated with Cipro and Flagyl.  Patient states that 1 of the medications made her very nauseated and she had to stop it.  She cannot recall which one.  She was evaluated at [facility] in August 2018.  She completed GI pathogen panel which was negative.  CRP was normal.  CBC normal.  LFTs normal.  Had a colonoscopy in August 2018, she had diverticulosis in the transverse and left colon, transverse colon polyp removed, tubular adenoma, random colon biopsies were negative.  Terminal ileum was normal.  Advised to have another colonoscopy in 5 years.  Patient states she was scheduled for breath test to rule out small bowel bacterial overgrowth however she presented on the wrong day initially and then she overslept the day she was supposed to go.  She never rescheduled.  Patient states her severe abdominal pain did resolve.  Her bowel function improved by the fact that she has occasional solid stool now.  Tends to still have 3-5 loose stools  daily, basically every time she urinates she has a BM.  She may go to 3 days without a bowel movement and then will cycle back to multiple stools daily.  Never takes Imodium or any antidiarrheals.  She did try Bentyl in the past without relief.  Never takes a laxative.  Infrequently has nocturnal stools.  No blood in the stool or melena.  Stools do not seem to be postprandial.  Also complains of bloating and gas intermittently.  Her heartburn is poorly controlled.  She used to be on omeprazole for about 8 years and did better but her insurance stopped paying for it.  She is been on pantoprazole 40 mg daily with refractory symptoms.  Complains of nocturnal reflux.  She has regurgitation when she bends over.  Complains of pill and solid food dysphagia especially to dry foods and meats.  Current Outpatient Medications  Medication Sig Dispense Refill  . cetirizine (ZYRTEC) 10 MG tablet Take 1 tablet (10 mg total) by mouth daily. 90 tablet 3  . estradiol (VIVELLE-DOT) 0.05 MG/24HR patch Place 1 patch (0.05 mg total) onto the skin 2 (two) times a week. 24 patch 0  . hydrochlorothiazide (HYDRODIURIL) 25 MG tablet Take 1 tablet (25 mg total) by mouth daily. 90 tablet 3  . pantoprazole (PROTONIX) 40 MG tablet Take 1 tablet (40 mg total) by mouth daily. 30 tablet 3  . potassium chloride (K-DUR) 10 MEQ tablet Take 2 tablets (20 mEq total) by mouth daily. 180 tablet 1  . progesterone (PROMETRIUM) 100 MG capsule Take 1  capsule (100 mg total) by mouth daily. 90 capsule 0  . venlafaxine XR (EFFEXOR-XR) 75 MG 24 hr capsule TAKE 1 CAPSULE BY MOUTH ONCE DAILY WITH BREAKFAST. 30 capsule 0   No current facility-administered medications for this visit.     Allergies as of 10/10/2017 - Review Complete 10/10/2017  Allergen Reaction Noted  . Other  02/27/2017  . Tape Swelling 01/17/2017    Past Medical History:  Diagnosis Date  . Allergy   . Complication of anesthesia    waking up too soon; severe itching and  rash  . Depression   . GERD (gastroesophageal reflux disease)   . Plantar fasciitis   . PONV (postoperative nausea and vomiting)     Past Surgical History:  Procedure Laterality Date  . COLONOSCOPY  01/2017   [facility]: Terminal ileum normal, transverse colon tubular adenoma removed, diverticulosis of the transverse and left colon.  Random colon biopsies negative.  Next colonoscopy in August 2023.  [facility] COLPOSCOPY    . DILITATION & CURRETTAGE/HYSTROSCOPY WITH NOVASURE ABLATION N/A 11/17/2014   Procedure: HYSTEROSCOPY, UTERINE CURETTAGE, ENDOMETRIAL ABLATION USING NOVASURE;  Surgeon: Jonnie Kind, MD;  Location: [facility];  Service: Gynecology;  Laterality: N/A;  Uterine Cavity Length 5.0cm Uterine Cavity Width 4.3cm Power 118 Time 1 minute 18 seconds  . ENDOMETRIAL ABLATION    . TUBAL LIGATION  12/1988  . WISDOM TOOTH EXTRACTION Bilateral 1996    Family History  Problem Relation Age of Onset  . Hypertension Mother   . Hypertension Father   . ADD / ADHD Sister   . Depression Sister   . Diabetes Daughter   . Diabetes Maternal Grandmother   . Heart disease Maternal Grandmother   . Ulcerative colitis Maternal Grandmother   . Cancer Maternal Grandfather        throat, prostate  . Hypertension Maternal Grandfather   . Diabetes Paternal Grandmother   . Hypertension Paternal Grandmother   . Arthritis Paternal Grandmother   . ADD / ADHD Sister   . Celiac disease Neg Hx   . Colon cancer Neg Hx     Social History   Socioeconomic History  . Marital status: Single    Spouse name: Not on file  . Number of children: Not on file  . Years of education: Not on file  . Highest education level: Not on file  Occupational History  . Not on file  Social Needs  . Financial resource strain: Not on file  . Food insecurity:    Worry: Not on file    Inability: Not on file  . Transportation needs:    Medical: Not on file    Non-medical: Not on file   Tobacco Use  . Smoking status: Current Every Day Smoker    Packs/day: 0.50    Years: 20.00    Pack years: 10.00    Types: Cigarettes  . Smokeless tobacco: Never Used  Substance and Sexual Activity  . Alcohol use: No  . Drug use: No  . Sexual activity: Yes    Partners: Male    Birth control/protection: Surgical  Lifestyle  . Physical activity:    Days per week: Not on file    Minutes per session: Not on file  . Stress: Not on file  Relationships  . Social connections:    Talks on phone: Not on file    Gets together: Not on file    Attends religious service: Not on file    Active  member of club or organization: Not on file    Attends meetings of clubs or organizations: Not on file    Relationship status: Not on file  . Intimate partner violence:    Fear of current or ex partner: Not on file    Emotionally abused: Not on file    Physically abused: Not on file    Forced sexual activity: Not on file  Other Topics Concern  . Not on file  Social History Narrative   Works at [employer] and [employer]. Grew up in [place], [place]. Single. Has three children.       ROS:  General: Negative for anorexia, weight loss, fever, chills, fatigue, weakness. Eyes: Negative for vision changes.  ENT: Negative for hoarseness, difficulty swallowing , nasal congestion. CV: Negative for chest pain, angina, palpitations, dyspnea on exertion, peripheral edema.  Respiratory: Negative for dyspnea at rest, dyspnea on exertion, cough, sputum, wheezing.  GI: See history of present illness. GU:  Negative for dysuria, hematuria, urinary incontinence, urinary frequency, nocturnal urination.  MS: Negative for joint pain, low back pain.  Derm: Negative for rash or itching.  Neuro: Negative for weakness, abnormal sensation, seizure, frequent headaches, memory loss, confusion.  Psych: Negative for suicidal ideation, hallucinations.  Positive depression. Endo: Negative for unusual weight change.  Heme: Negative  for bruising or bleeding. Allergy: Negative for rash or hives.    Physical Examination:  BP 120/80   Pulse 80   Temp (!) 97 F (36.1 C) (Oral)   Ht 5' 6.5" (1.689 m)   Wt 202 lb 6.4 oz (91.8 kg)   BMI 32.18 kg/m    General: Well-nourished, well-developed in no acute distress.  Head: Normocephalic, atraumatic.   Eyes: Conjunctiva pink, no icterus. Mouth: Oropharyngeal mucosa moist and pink. Neck: Supple  Lungs: Clear to auscultation bilaterally.  Heart: Regular rate and rhythm, no murmurs rubs or gallops.  Abdomen: Bowel sounds are normal, nontender, nondistended, no hepatosplenomegaly or masses, no abdominal bruits or    hernia , no rebound or guarding.   Rectal: Not performed Extremities: No lower extremity edema. No clubbing or deformities.  Neuro: Alert and oriented x 4 , grossly normal neurologically.  Skin: Warm and dry, no rash or jaundice.   Psych: Alert and cooperative, normal mood and affect.  Labs: Lab Results  Component Value Date   CREATININE 0.82 09/02/2017   BUN 9 09/02/2017   NA 138 09/02/2017   K 3.5 09/02/2017   CL 102 09/02/2017   CO2 28 09/02/2017   Lab Results  Component Value Date   ALT 14 12/01/2016   AST 15 12/01/2016   ALKPHOS 56 12/01/2016   BILITOT 0.2 (L) 12/01/2016   Lab Results  Component Value Date   WBC 10.7 (H) 05/25/2017   HGB 13.8 05/25/2017   HCT 41.7 05/25/2017   MCV 91.9 05/25/2017   PLT 292 05/25/2017     Imaging Studies: No results found.  Impression/plan:  Very pleasant 52 year old female presenting with refractory GERD.  She has chronic GI issues.  Worsening from her baseline last year when she developed severe pain, CT concerning for diverticulitis versus appendicitis.  Seen by general surgery who felt her symptoms are more classic for diverticulitis and she was empirically treated with antibiotic therapy.  Patient reports that her severe pain did resolve.  She is back at baseline loose stools with occasional solid  stools.  Intermittent bloating.  Heartburn not well managed on pantoprazole.  She also has solid food and pill dysphagia.  Discussed with patient at length.  She needs upper endoscopy to rule out complicated GERD, esophageal stricture, evaluate for gastritis/peptic ulcer disease.  Given her chronic diarrhea, bloating we will also screen for celiac disease.  If positive serologies she will need small bowel biopsies performed at time of upper endoscopy.  I have discussed the risks, alternatives, benefits with regards to but not limited to the risk of reaction to medication, bleeding, infection, perforation and the patient is agreeable to proceed. Written consent to be obtained. We will also check thyroid status.   We will switch her pantoprazole to Dexilant 60 mg daily.  Reinforced antireflux measures.  Added with FODMAP diet to avoid foods in the "high" FODMAP column.

## 2017-10-10 NOTE — Patient Instructions (Signed)
1. Please have your labs done. We will be in touch with results as available. 2. Stop pantoprazole. Start Dexilant once daily before breakfast. PLEASE PROVIDE SAVINGS CARD TO PHARMACY TO REDUCE YOUR COPAY. 3. Upper endoscopy as scheduled. See separate instructions.  4. Read over the FODMAP diet. If you are consuming any items in the "HIGH" column frequently, please cut back. This foods will cause significant gas in IBS patients.

## 2017-10-10 NOTE — Progress Notes (Signed)
cc'ed to pcp °

## 2017-11-11 ENCOUNTER — Encounter: Payer: Self-pay | Admitting: Gastroenterology

## 2017-11-12 ENCOUNTER — Other Ambulatory Visit: Payer: Self-pay

## 2017-11-12 ENCOUNTER — Ambulatory Visit (HOSPITAL_COMMUNITY)
Admission: RE | Admit: 2017-11-12 | Discharge: 2017-11-12 | Disposition: A | Discharge status: Home / Self Care | Payer: 59 | Source: Ambulatory Visit | Attending: Gastroenterology | Admitting: Gastroenterology

## 2017-11-12 ENCOUNTER — Encounter (HOSPITAL_COMMUNITY): Payer: Self-pay | Admitting: Gastroenterology

## 2017-11-12 ENCOUNTER — Encounter (HOSPITAL_COMMUNITY)
Admission: RE | Disposition: A | Discharge status: Home / Self Care | Payer: Self-pay | Source: Ambulatory Visit | Attending: Gastroenterology

## 2017-11-12 DIAGNOSIS — R197 Diarrhea, unspecified: Secondary | ICD-10-CM | POA: Diagnosis not present

## 2017-11-12 DIAGNOSIS — D132 Benign neoplasm of duodenum: Secondary | ICD-10-CM | POA: Insufficient documentation

## 2017-11-12 DIAGNOSIS — R1013 Epigastric pain: Secondary | ICD-10-CM

## 2017-11-12 DIAGNOSIS — R1010 Upper abdominal pain, unspecified: Secondary | ICD-10-CM | POA: Insufficient documentation

## 2017-11-12 DIAGNOSIS — K2970 Gastritis, unspecified, without bleeding: Secondary | ICD-10-CM

## 2017-11-12 DIAGNOSIS — F17210 Nicotine dependence, cigarettes, uncomplicated: Secondary | ICD-10-CM | POA: Insufficient documentation

## 2017-11-12 DIAGNOSIS — K2950 Unspecified chronic gastritis without bleeding: Secondary | ICD-10-CM | POA: Diagnosis not present

## 2017-11-12 DIAGNOSIS — D130 Benign neoplasm of esophagus: Secondary | ICD-10-CM | POA: Insufficient documentation

## 2017-11-12 DIAGNOSIS — R1310 Dysphagia, unspecified: Secondary | ICD-10-CM | POA: Diagnosis not present

## 2017-11-12 DIAGNOSIS — K219 Gastro-esophageal reflux disease without esophagitis: Secondary | ICD-10-CM

## 2017-11-12 DIAGNOSIS — Z79899 Other long term (current) drug therapy: Secondary | ICD-10-CM | POA: Insufficient documentation

## 2017-11-12 DIAGNOSIS — R1319 Other dysphagia: Secondary | ICD-10-CM

## 2017-11-12 HISTORY — PX: SAVORY DILATION: SHX5439

## 2017-11-12 HISTORY — PX: ESOPHAGOGASTRODUODENOSCOPY: SHX5428

## 2017-11-12 HISTORY — PX: BIOPSY: SHX5522

## 2017-11-12 SURGERY — EGD (ESOPHAGOGASTRODUODENOSCOPY)
Anesthesia: Moderate Sedation

## 2017-11-12 MED ORDER — MEPERIDINE HCL 100 MG/ML IJ SOLN
INTRAMUSCULAR | Status: DC | PRN
  Administered 2017-11-12: 50 mg
  Administered 2017-11-12 (×2): 25 mg

## 2017-11-12 MED ORDER — MEPERIDINE HCL 100 MG/ML IJ SOLN
INTRAMUSCULAR | Status: AC
  Filled 2017-11-12: qty 2

## 2017-11-12 MED ORDER — MIDAZOLAM HCL 5 MG/5ML IJ SOLN
INTRAMUSCULAR | Status: DC | PRN
  Administered 2017-11-12: 2 mg via INTRAVENOUS
  Administered 2017-11-12: 1 mg via INTRAVENOUS
  Administered 2017-11-12: 2 mg via INTRAVENOUS

## 2017-11-12 MED ORDER — LIDOCAINE VISCOUS HCL 2 % MT SOLN
OROMUCOSAL | Status: AC
  Filled 2017-11-12: qty 15

## 2017-11-12 MED ORDER — SODIUM CHLORIDE 0.9 % IV SOLN
INTRAVENOUS | Status: DC
  Administered 2017-11-12: 1000 mL via INTRAVENOUS

## 2017-11-12 MED ORDER — PROMETHAZINE HCL 25 MG/ML IJ SOLN
INTRAMUSCULAR | Status: AC
  Filled 2017-11-12: qty 1

## 2017-11-12 MED ORDER — MIDAZOLAM HCL 5 MG/5ML IJ SOLN
INTRAMUSCULAR | Status: AC
  Filled 2017-11-12: qty 10

## 2017-11-12 MED ORDER — LIDOCAINE VISCOUS HCL 2 % MT SOLN
OROMUCOSAL | Status: DC | PRN
  Administered 2017-11-12: 1 via OROMUCOSAL

## 2017-11-12 MED ORDER — MINERAL OIL PO OIL
TOPICAL_OIL | ORAL | Status: AC
  Filled 2017-11-12: qty 30

## 2017-11-12 MED ORDER — PROMETHAZINE HCL 25 MG/ML IJ SOLN
INTRAMUSCULAR | Status: DC | PRN
  Administered 2017-11-12: 12.5 mg via INTRAVENOUS

## 2017-11-12 NOTE — Op Note (Signed)
[facility] Patient Name: Lorraine Kelly Procedure Date: 11/12/2017 1:18 PM MRN: [id number] Date of Birth: 17-Feb-1966 Attending MD: Barney Drain MD, MD CSN: [id number] Age: 52 Admit Type: Outpatient Procedure:                Upper GI endoscopy WITH COLD FORCEPS                            BIOPSY/ESOPHAGEAL DILATION Indications:              Upper abdominal pain, Dysphagia SINCE HER teens,                            intermittent Diarrhea Providers:                Barney Drain MD, MD, Janeece Riggers, RN, Nelma Rothman,                            Technician Referring MD:             Caren Macadam Medicines:                Promethazine 12.5 mg IV, Meperidine 100 mg IV,                            Midazolam 5 mg IV Complications:            No immediate complications. Estimated Blood Loss:     Estimated blood loss was minimal. Procedure:                Pre-Anesthesia Assessment:                           - Prior to the procedure, a History and Physical                            was performed, and patient medications and                            allergies were reviewed. The patient's tolerance of                            previous anesthesia was also reviewed. The risks                            and benefits of the procedure and the sedation                            options and risks were discussed with the patient.                            All questions were answered, and informed consent                            was obtained. Prior Anticoagulants: The patient has  taken aspirin. ASA Grade Assessment: II - A patient                            with mild systemic disease. After reviewing the                            risks and benefits, the patient was deemed in                            satisfactory condition to undergo the procedure.                            After obtaining informed consent, the endoscope was                            passed under direct  vision. Throughout the                            procedure, the patient's blood pressure, pulse, and                            oxygen saturations were monitored continuously. The                            [id number] ([id number]) scope was introduced through the                            mouth, and advanced to the second part of duodenum.                            The upper GI endoscopy was somewhat difficult due                            to enhanced patient gag reflex causing intolerance                            of esophageal intubation. The patient tolerated the                            procedure fairly well. Scope In: 2:04:54 PM Scope Out: 2:21:51 PM Total Procedure Duration: 0 hours 16 minutes 57 seconds  Findings:      IT WAS SLIGHTLY DIFFICULT TO INTUBATE THE UES. No OBVIOUS endoscopic       abnormality was evident in the esophagus to explain the patient's       complaint of dysphagia. It was decided, to proceed with dilation of the       entire esophagus. A guidewire was placed and the scope was withdrawn.       Dilation was performed with a Savary dilator with no resistance at 12.8       mm, mild resistance at 14 mm and moderate resistance at 15 mm, 16 mm and       17 mm. Biopsies were obtained from the proximal and distal esophagus       with cold forceps  for histology of suspected eosinophilic esophagitis.      Patchy mild inflammation characterized by congestion (edema) and       erythema was found in the gastric antrum. Biopsies were taken with a       cold forceps for Helicobacter pylori testing.      The examined duodenum was normal. Biopsies for histology were taken with       a cold forceps for evaluation of celiac disease. Impression:               - No endoscopic esophageal abnormality to explain                            patient's dysphagia. Esophagus dilated DUE TO                            POSSIBLE PROXIMAL ESOPHAGEAL WEB. Dilated. Biopsied.                            - MILD Gastritis. Biopsied. Moderate Sedation:      Moderate (conscious) sedation was administered by the endoscopy nurse       and supervised by the endoscopist. The following parameters were       monitored: oxygen saturation, heart rate, blood pressure, and response       to care. Total physician intraservice time was 35 minutes. Recommendation:           - Await pathology results.                           - Low fat diet. AVOID REFLUX TRIGGERS. LOSE WEIGHT                            TO BMI > 30.                           - Continue present medications.                           - Return to my office in 4 months.                           - Patient has a contact number available for                            emergencies. The signs and symptoms of potential                            delayed complications were discussed with the                            patient. Return to normal activities tomorrow.                            Written discharge instructions were provided to the                            patient.  Procedure Code(s):        --- Professional ---                           [phone], Esophagogastroduodenoscopy, flexible,                            transoral; with insertion of guide wire followed by                            passage of dilator(s) through esophagus over guide                            wire                           43239, Esophagogastroduodenoscopy, flexible,                            transoral; with biopsy, single or multiple                           G0500, Moderate sedation services provided by the                            same physician or other qualified health care                            professional performing a gastrointestinal                            endoscopic service that sedation supports,                            requiring the presence of an independent trained                            observer to assist in the monitoring of  the                            patient's level of consciousness and physiological                            status; initial 15 minutes of intra-service time;                            patient age 31 years or older (additional time may                            be reported with [phone], as appropriate)                           [phone], Moderate sedation services provided by the                            same physician or other  qualified health care                            professional performing the diagnostic or                            therapeutic service that the sedation supports,                            requiring the presence of an independent trained                            observer to assist in the monitoring of the                            patient's level of consciousness and physiological                            status; each additional 15 minutes intraservice                            time (List separately in addition to code for                            primary service) Diagnosis Code(s):        --- Professional ---                           R13.10, Dysphagia, unspecified                           K29.70, Gastritis, unspecified, without bleeding                           R10.10, Upper abdominal pain, unspecified                           R19.7, Diarrhea, unspecified CPT copyright 2017 American Medical Association. All rights reserved. The codes documented in this report are preliminary and upon coder review may  be revised to meet current compliance requirements. Barney Drain, MD Barney Drain MD, MD 11/12/2017 2:35:15 PM This report has been signed electronically. Number of Addenda: 0

## 2017-11-12 NOTE — Discharge Instructions (Signed)
I dilated your esophagus. The top of your esophagus was  NARROW. You have MILD gastritis. I biopsied your ESOPHAGUS, stomach, and small bowel.    CONTINUE YOUR WEIGHT LOSS EFFORTS.  WHILE I DO NOT WANT TO ALARM YOU, YOUR BODY MASS INDEX IS OVER 30 WHICH MEANS YOU ARE OBESE. OBESITY IS ASSOCIATED WITH AN INCREASE RISK FOR ALL CANCERS, INCLUDING ESOPHAGEAL AND COLON CANCER. A WEIGHT OF 185 LBS OR LESS WILL KEEP YOUR BODY MASS INDEX(BMI) UNDER 30.  DRINK WATER TO KEEP YOUR URINE LIGHT YELLOW.  FOLLOW A LOW FAT DIET. MEATS SHOULD BE BAKED, BROILED, OR BOILED. AVOID FRIED FOODS. SEE INFO BELOW.  AVOID REFLUX TRIGGERS. SEE INFO BELOW.  DO NOT EAT FAST FOOD, Do not drink DIET SODA, AVOID HIGH FRUCTOSE CORN SYRUP, chew SUGAR FREE GUM, OR USE ARTIFICIAL SWEETENERS. USE STEVIA AS A SWEETENER OR AGAVE NECTAR.  CONTINUE PROTONIX OR DEXILANT TO CONTROL HEARTBURN. TAKE 30 MINUTES PRIOR TO MEALS TWICE DAILY.  YOUR BIOPSY RESULTS WILL BE AVAILABLE IN 7 DAYS.   FOLLOW UP IN 4 MOS E30 DYSPHAGIA/DYSPEPSIA.  UPPER ENDOSCOPY AFTER CARE Read the instructions outlined below and refer to this sheet in the next week. These discharge instructions provide you with general information on caring for yourself after you leave the hospital. While your treatment has been planned according to the most current medical practices available, unavoidable complications occasionally occur. If you have any problems or questions after discharge, call DR. Orby Tangen, [phone].  ACTIVITY  You may resume your regular activity, but move at a slower pace for the next 24 hours.   Take frequent rest periods for the next 24 hours.   Walking will help get rid of the air and reduce the bloated feeling in your belly (abdomen).   No driving for 24 hours (because of the medicine (anesthesia) used during the test).   You may shower.   Do not sign any important legal documents or operate any machinery for 24 hours (because of the  anesthesia used during the test).    NUTRITION  Drink plenty of fluids.   You may resume your normal diet as instructed by your doctor.   Begin with a light meal and progress to your normal diet. Heavy or fried foods are harder to digest and may make you feel sick to your stomach (nauseated).   Avoid alcoholic beverages for 24 hours or as instructed.    MEDICATIONS  You may resume your normal medications.   WHAT YOU CAN EXPECT TODAY  Some feelings of bloating in the abdomen.   Passage of more gas than usual.    IF YOU HAD A BIOPSY TAKEN DURING THE UPPER ENDOSCOPY:  Eat a soft diet IF YOU HAVE NAUSEA, BLOATING, ABDOMINAL PAIN, OR VOMITING.    FINDING OUT THE RESULTS OF YOUR TEST Not all test results are available during your visit. DR. Oneida Alar WILL CALL YOU WITHIN 14 DAYS OF YOUR PROCEDUE WITH YOUR RESULTS. Do not assume everything is normal if you have not heard from DR. Oluwatobiloba Martin, CALL HER OFFICE AT [phone].  SEEK IMMEDIATE MEDICAL ATTENTION AND CALL THE OFFICE: [phone] IF:  You have more than a spotting of blood in your stool.   Your belly is swollen (abdominal distention).   You are nauseated or vomiting.   You have a temperature over 101F.   You have abdominal pain or discomfort that is severe or gets worse throughout the day.  DYSPHAGIA DYSPHAGIA can be caused by stomach acid backing up into  the tube that carries food from the mouth down to the stomach (lower esophagus).  TREATMENT There are a number of medicines used to treat DYSPHAGIA including: Antacids.  Proton-pump inhibitors: PROTONIX OR DEXILANT ZANTAC/PEPCID    Lifestyle and home remedies to control REFLUX and regurgitation  You may eliminate or reduce the frequency of heartburn by making the following lifestyle changes:   Control your weight. Being overweight is a major risk factor for heartburn and GERD. Excess pounds put pressure on your abdomen, pushing up your stomach and causing  acid to back up into your esophagus.    Eat smaller meals. 4 TO 6 MEALS A DAY. This reduces pressure on the lower esophageal sphincter, helping to prevent the valve from opening and acid from washing back into your esophagus.    Loosen your belt. Clothes that fit tightly around your waist put pressure on your abdomen and the lower esophageal sphincter.     Eliminate heartburn triggers. Everyone has specific triggers. Common triggers such as fatty or fried foods, spicy food, tomato sauce, carbonated beverages, alcohol, chocolate, mint, garlic, onion, caffeine and nicotine may make heartburn worse.    Avoid stooping or bending. Tying your shoes is OK. Bending over for longer periods to weed your garden isn't, especially soon after eating.    Don't lie down after a meal. Wait at least three to four hours after eating before going to bed, and don't lie down right after eating.   Alternative medicine  Several home remedies exist for treating GERD, but they provide only temporary relief. They include drinking baking soda (sodium bicarbonate) added to water or drinking other fluids such as baking soda mixed with cream of tartar and water.  Although these liquids create temporary relief by neutralizing, washing away or buffering acids, eventually they aggravate the situation by adding gas and fluid to your stomach, increasing pressure and causing more acid reflux. Further, adding more sodium to your diet may increase your blood pressure and add stress to your heart, and excessive bicarbonate ingestion can alter the acid-base balance in your body.

## 2017-11-12 NOTE — H&P (Signed)
Primary Care Physician:  Caren Macadam, MD Primary Gastroenterologist:  Dr. Oneida Alar  Pre-Procedure History & Physical: HPI:  Lorraine Kelly is a 52 y.o. female here for DYSPHAGIA.  Past Medical History:  Diagnosis Date  . Allergy   . Complication of anesthesia    waking up too soon; severe itching and rash  . Depression   . GERD (gastroesophageal reflux disease)   . Plantar fasciitis   . PONV (postoperative nausea and vomiting)     Past Surgical History:  Procedure Laterality Date  . COLONOSCOPY  01/2017   [facility]: Terminal ileum normal, transverse colon tubular adenoma removed, diverticulosis of the transverse and left colon.  Random colon biopsies negative.  Next colonoscopy in August 2023.  Marland Kitchen COLPOSCOPY    . DILITATION & CURRETTAGE/HYSTROSCOPY WITH NOVASURE ABLATION N/A 11/17/2014   Procedure: HYSTEROSCOPY, UTERINE CURETTAGE, ENDOMETRIAL ABLATION USING NOVASURE;  Surgeon: Jonnie Kind, MD;  Location: [facility];  Service: Gynecology;  Laterality: N/A;  Uterine Cavity Length 5.0cm Uterine Cavity Width 4.3cm Power 118 Time 1 minute 18 seconds  . ENDOMETRIAL ABLATION    . TUBAL LIGATION  12/1988  . WISDOM TOOTH EXTRACTION Bilateral 1996    Prior to Admission medications   Medication Sig Start Date End Date Taking? Authorizing Provider  aspirin-acetaminophen-caffeine (EXCEDRIN MIGRAINE) [phone] MG tablet Take 2 tablets by mouth daily as needed for headache.   Yes [provider]  cetirizine (ZYRTEC) 10 MG tablet Take 1 tablet (10 mg total) by mouth daily. 05/27/17  Yes Caren Macadam, MD  dexlansoprazole (DEXILANT) 60 MG capsule Take 1 capsule (60 mg total) by mouth daily before breakfast. 10/10/17  Yes Mahala Menghini, PA-C  hydrochlorothiazide (HYDRODIURIL) 25 MG tablet Take 1 tablet (25 mg total) by mouth daily. 05/27/17  Yes Caren Macadam, MD  naproxen sodium (ALEVE) 220 MG tablet Take 220 mg by mouth.   Yes [provider]  potassium chloride (K-DUR) 10 MEQ tablet Take 2 tablets (20 mEq total) by mouth daily. 09/05/17  Yes Hagler, Apolonio Schneiders, MD  progesterone (PROMETRIUM) 100 MG capsule Take 1 capsule (100 mg total) by mouth daily. 09/02/17  Yes Hagler, Apolonio Schneiders, MD  venlafaxine XR (EFFEXOR-XR) 75 MG 24 hr capsule TAKE 1 CAPSULE BY MOUTH ONCE DAILY WITH BREAKFAST. 07/21/17  Yes Hagler, Apolonio Schneiders, MD  estradiol (VIVELLE-DOT) 0.05 MG/24HR patch Place 1 patch (0.05 mg total) onto the skin 2 (two) times a week. 09/04/17   Caren Macadam, MD  ibuprofen (ADVIL,MOTRIN) 200 MG tablet Take 600-800 mg by mouth daily as needed for headache or moderate pain.    [provider]  pantoprazole (PROTONIX) 40 MG tablet Take 1 tablet (40 mg total) by mouth daily. Patient not taking: Reported on 11/06/2017 09/02/17   Caren Macadam, MD    Allergies as of 10/10/2017 - Review Complete 10/10/2017  Allergen Reaction Noted  . Other  02/27/2017  . Tape Swelling 01/17/2017    Family History  Problem Relation Age of Onset  . Hypertension Mother   . Hypertension Father   . ADD / ADHD Sister   . Depression Sister   . Diabetes Daughter   . Diabetes Maternal Grandmother   . Heart disease Maternal Grandmother   . Ulcerative colitis Maternal Grandmother   . Cancer Maternal Grandfather        throat, prostate  . Hypertension Maternal Grandfather   . Diabetes Paternal Grandmother   . Hypertension Paternal Grandmother   . Arthritis Paternal Grandmother   .  ADD / ADHD Sister   . Celiac disease Neg Hx   . Colon cancer Neg Hx     Social History   Socioeconomic History  . Marital status: Single    Spouse name: Not on file  . Number of children: Not on file  . Years of education: Not on file  . Highest education level: Not on file  Occupational History  . Not on file  Social Needs  . Financial resource strain: Not on file  . Food insecurity:    Worry: Not on file    Inability: Not on file  . Transportation needs:    Medical:  Not on file    Non-medical: Not on file  Tobacco Use  . Smoking status: Current Every Day Smoker    Packs/day: 0.50    Years: 20.00    Pack years: 10.00    Types: Cigarettes  . Smokeless tobacco: Never Used  Substance and Sexual Activity  . Alcohol use: Yes    Comment: socially  . Drug use: No  . Sexual activity: Yes    Partners: Male    Birth control/protection: Surgical  Lifestyle  . Physical activity:    Days per week: Not on file    Minutes per session: Not on file  . Stress: Not on file  Relationships  . Social connections:    Talks on phone: Not on file    Gets together: Not on file    Attends religious service: Not on file    Active member of club or organization: Not on file    Attends meetings of clubs or organizations: Not on file    Relationship status: Not on file  . Intimate partner violence:    Fear of current or ex partner: Not on file    Emotionally abused: Not on file    Physically abused: Not on file    Forced sexual activity: Not on file  Other Topics Concern  . Not on file  Social History Narrative   Works at [employer] and [employer]. Grew up in [place], [place]. Single. Has three children.     Review of Systems: See HPI, otherwise negative ROS   Physical Exam: BP 108/72   Pulse 71   Temp 98.4 F (36.9 C) (Oral)   Resp 14   Ht 5' 6.5" (1.689 m)   Wt 200 lb (90.7 kg)   SpO2 97%   BMI 31.80 kg/m  General:   Alert,  pleasant and cooperative in NAD Head:  Normocephalic and atraumatic. Neck:  Supple; Lungs:  Clear throughout to auscultation.    Heart:  Regular rate and rhythm. Abdomen:  Soft, nontender and nondistended. Normal bowel sounds, without guarding, and without rebound.   Neurologic:  Alert and  oriented x4;  grossly normal neurologically.  Impression/Plan:     DYSPHAGIA  PLAN:  EGD/DIL TODAY. DISCUSSED PROCEDURE, BENEFITS, & RISKS: < 1% chance of medication reaction, bleeding, OR perforation.

## 2017-11-14 ENCOUNTER — Encounter: Payer: Self-pay | Admitting: Family Medicine

## 2017-11-17 ENCOUNTER — Encounter: Payer: Self-pay | Admitting: Family Medicine

## 2017-11-18 ENCOUNTER — Encounter (HOSPITAL_COMMUNITY): Payer: Self-pay | Admitting: Gastroenterology

## 2017-11-19 ENCOUNTER — Other Ambulatory Visit: Payer: Self-pay

## 2017-11-19 ENCOUNTER — Telehealth: Payer: Self-pay

## 2017-11-19 ENCOUNTER — Ambulatory Visit: Payer: 59 | Admitting: Family Medicine

## 2017-11-19 ENCOUNTER — Encounter: Payer: Self-pay | Admitting: Family Medicine

## 2017-11-19 VITALS — BP 116/80 | HR 87 | Temp 98.2°F | Resp 16 | Ht 66.5 in | Wt 202.0 lb

## 2017-11-19 DIAGNOSIS — F339 Major depressive disorder, recurrent, unspecified: Secondary | ICD-10-CM

## 2017-11-19 DIAGNOSIS — F419 Anxiety disorder, unspecified: Secondary | ICD-10-CM

## 2017-11-19 DIAGNOSIS — Z8659 Personal history of other mental and behavioral disorders: Secondary | ICD-10-CM | POA: Diagnosis not present

## 2017-11-19 DIAGNOSIS — Z915 Personal history of self-harm: Secondary | ICD-10-CM

## 2017-11-19 DIAGNOSIS — Z9151 Personal history of suicidal behavior: Secondary | ICD-10-CM

## 2017-11-19 DIAGNOSIS — Z566 Other physical and mental strain related to work: Secondary | ICD-10-CM

## 2017-11-19 NOTE — Progress Notes (Signed)
Although the patient did have SI without plan/intent last week, she denies any SI on intake. It is the best that the patient to be followed at [facility] given the patient is already seen by them. [place] specialist to contact the patient to ensure follow up with her therapist and also advise the patient to be seen by psychiatrist. She will be also advised to contact [facility] for appropriate care if any worsening in her symptoms. [place] specialist will remind patient of emergency resources if any worsening SI.   Will make the patient inactive in our program given the patient would benefit from higher level of care at [facility].
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
## 2017-11-20 NOTE — Progress Notes (Signed)
CC'D TO PCP AND ON RECALL  °

## 2017-11-20 NOTE — Progress Notes (Signed)
PATIENT SCHEDULED FOR FOLLOW UP 

## 2017-11-20 NOTE — Progress Notes (Signed)
LMOM to call.

## 2017-11-24 ENCOUNTER — Encounter: Payer: Self-pay | Admitting: Family Medicine

## 2017-11-26 NOTE — Progress Notes (Signed)
Letter mailed to pt to call.  

## 2017-12-01 ENCOUNTER — Encounter: Payer: Self-pay | Admitting: Family Medicine

## 2017-12-01 ENCOUNTER — Telehealth: Payer: Self-pay | Admitting: Family Medicine

## 2017-12-01 NOTE — Telephone Encounter (Signed)
Patient canceled her appt tomorrow and wanted you to know.  She wanted to be worked in Thursday or Friday or one day next week but we do have any openings.  She ask me to let you know. She said you were aware of her issues.

## 2017-12-01 NOTE — Telephone Encounter (Signed)
I can not work anyone in. She should keep her other scheduled appointment. Gwen Her. Mannie Stabile, MD

## 2017-12-01 NOTE — Telephone Encounter (Signed)
Spoke with patient and let her know we would be unable to work her in Thursday or Friday and she should keep her scheduled appointment for tomorrow. I let her know Dr.Hagler is booked up until August. She said there is just no way she can keep the appointment for tomorrow. It's impossible.

## 2017-12-02 ENCOUNTER — Ambulatory Visit: Payer: 59 | Admitting: Family Medicine

## 2017-12-05 ENCOUNTER — Telehealth: Payer: Self-pay | Admitting: Gastroenterology

## 2017-12-05 NOTE — Telephone Encounter (Signed)
LMOM to call. ( See path results of 11/12/2017).

## 2017-12-05 NOTE — Telephone Encounter (Signed)
[phone]   PATIENT RETURNING CALL, SAID WE HAVE TRIED TO REACH HER SEVERAL TIMES

## 2017-12-05 NOTE — Telephone Encounter (Signed)
Pt is aware of path results . She said she is taking the Dexilant 60 mg once a day. Dr. Nona Dell, please confirm once a day dosing.

## 2017-12-09 NOTE — Telephone Encounter (Signed)
PLEASE CALL PT. SHE SHOULD TAKE DEXILANT ONCE DAILY. SHE DOES NOT NEED PROTONIX IF SHE IS TAKING DEXILANT.

## 2017-12-10 NOTE — Telephone Encounter (Signed)
LMOM to call.

## 2017-12-12 NOTE — Telephone Encounter (Signed)
LMOM to call and also mailed a letter with the info.

## 2018-03-17 ENCOUNTER — Telehealth: Payer: Self-pay | Admitting: Gastroenterology

## 2018-03-17 ENCOUNTER — Encounter: Payer: Self-pay | Admitting: Gastroenterology

## 2018-03-17 ENCOUNTER — Ambulatory Visit: Payer: 59 | Admitting: Gastroenterology

## 2018-03-17 NOTE — Telephone Encounter (Signed)
Patient was a no show and letter sent  °

## 2018-04-03 ENCOUNTER — Other Ambulatory Visit (HOSPITAL_COMMUNITY): Payer: Self-pay | Admitting: Nurse Practitioner

## 2018-04-03 DIAGNOSIS — Z1231 Encounter for screening mammogram for malignant neoplasm of breast: Secondary | ICD-10-CM

## 2018-04-13 ENCOUNTER — Ambulatory Visit (HOSPITAL_COMMUNITY)
Admission: RE | Admit: 2018-04-13 | Discharge: 2018-04-13 | Disposition: A | Discharge status: Home / Self Care | Payer: 59 | Source: Ambulatory Visit | Attending: Nurse Practitioner | Admitting: Nurse Practitioner

## 2018-04-13 DIAGNOSIS — Z1231 Encounter for screening mammogram for malignant neoplasm of breast: Secondary | ICD-10-CM | POA: Diagnosis not present

## 2018-04-27 DIAGNOSIS — Z0000 Encounter for general adult medical examination without abnormal findings: Secondary | ICD-10-CM | POA: Diagnosis not present

## 2018-05-01 ENCOUNTER — Telehealth (HOSPITAL_COMMUNITY): Payer: Self-pay | Admitting: Professional

## 2018-05-05 ENCOUNTER — Telehealth (HOSPITAL_COMMUNITY): Payer: Self-pay | Admitting: Professional

## 2018-05-05 NOTE — Telephone Encounter (Signed)
Cln returned pt's call. Pt reports issues with depression, anxiety, and PTSD. Pt denies current SI with plan/intent, HI, AVH, and endorses passive SI daily. Cln oriented to group and pt agrees to come in for ax to try. Pt participated in group therapy "many years ago" and reports it was helpful. Pt schedule for 12/2 @ 2:30; told to arrive at 1:30 for pw. Address provided. Pt told to bring insurance card and ID. Pt also provided with crisis hotline number [phone] if needed.

## 2018-05-11 ENCOUNTER — Ambulatory Visit (HOSPITAL_COMMUNITY)
Admission: EM | Admit: 2018-05-11 | Discharge: 2018-05-11 | Disposition: A | Discharge status: Home / Self Care | Payer: 59

## 2018-05-11 ENCOUNTER — Telehealth (HOSPITAL_COMMUNITY): Payer: Self-pay | Admitting: Professional

## 2018-05-11 ENCOUNTER — Telehealth (HOSPITAL_COMMUNITY): Payer: Self-pay

## 2018-05-11 ENCOUNTER — Ambulatory Visit (HOSPITAL_COMMUNITY): Payer: 59 | Admitting: Professional

## 2018-05-11 DIAGNOSIS — Z79899 Other long term (current) drug therapy: Secondary | ICD-10-CM | POA: Diagnosis not present

## 2018-05-11 DIAGNOSIS — R079 Chest pain, unspecified: Secondary | ICD-10-CM | POA: Diagnosis not present

## 2018-05-11 DIAGNOSIS — R0789 Other chest pain: Secondary | ICD-10-CM | POA: Diagnosis not present

## 2018-05-11 NOTE — ED Notes (Signed)
Pt here for 2 weeks of chest pain, diaphoresis, slurred speech, headache, and bilateral arm pain and tingling.  Pt states she was at [facility] today when she was having chest pain and they sent her here.  It was explained that we would be happy to assess her but that due to her symptoms we would be very likely to send her to the ED.  Pt opted to go to ED.  Dr. Meda Coffee aware.

## 2018-05-11 NOTE — Telephone Encounter (Signed)
Cln returned pt's calls. Cln apologized to pt for the lapse in time due to being closed Thursday-Sunday. Cln told pt she was trying to reschedule pt with another cln due to this cln schedule being booked for the week. Pt reports she is almost at the office and will keep today's appt. Cln agreed.

## 2018-05-11 NOTE — Telephone Encounter (Signed)
Medication management - Met with patient in for a Comprehensive Clinical Assessment to start the Partial Hospitalization Program with Loistine Chance, [facility] as patient presented with reported chest discomfort and stated shortness of breath X 3-4 days.  Patient reported she just had not been feeling good for several days and was getting some left arm and now right arm pains at times as well.  Patient was not diaphoretic or breathing heavily but vitals revealed pulse of 102, O2 of 98 %, BP = 134/98 and respirations 18.  Patient reported she had been off blood pressure medications for several months after she left a job that was stressful and her and her provider thought maybe she could see if BP medication still needed since was within normal limits at stated last evaluation.  Informed patient of need to be medically evaluated due to SOB, some periodic chest pains, high BP and pulse.  Patient agreed with this plan but did not want to go to local ED across the street due to potential cost. Stated she would call her new PCP, Dr. Arsenio Katz, at [facility] and go there.  This nurse and counselor discussed with patient concern for her to drive so far for evaluation as informed this needed to be checked today per her reported symptoms.  Patient agreed and stated would go to local Urgent Care on the [address].  Directions given to patient as she stated she wanted to drive herself and was fine at this time to drive.  Patient left with agreement to call our office back later and patient did visit [facility] for follow up this date.

## 2018-05-11 NOTE — Progress Notes (Deleted)
2:55 - 3:10  Pt spent 1.5 hours filling out paperwork in the lobby. Cln took pt at 2:55 for a 2:30 appt. When cln and pt got into room, pt reported she was "burning up" and started listing symptoms including chest pains, tingling in arms, shortness of breath, nausea, dizziness, headaches, and feeling over heated. Cln went to get Beather Arbour, RN, to take pt's vitals. RN took pt's vitals and reports BP is high. Pt reports she initially wanted to reschedule this ax due to needing to see her PCP because of the symptoms but she did not go to PCP because she did not have the money for her co-pay. RN discussed with pt that her PCP would have to see her even without her copay and could bill her later. Pt reports PCP is in [place]. RN encouraged pt to go to the ED or urgent care (due to pt's concern about cost)  to get checked out due to PCP being so far away and symptoms persisting for numerous days. Pt reports she feels she can drive and agrees to leave this office and go to [facility] to get checked out. Cln encourages pt to f/u with PHP after medical issues under control. Pt agrees. Address and phone number for [facility] was provided to pt. Pt reports numerous times she is going straight to Urgent Care.  Hadrian Yarbrough, LPCA, LCASA

## 2018-05-14 ENCOUNTER — Other Ambulatory Visit (HOSPITAL_COMMUNITY): Payer: 59 | Attending: Psychiatry | Admitting: Licensed Clinical Social Worker

## 2018-05-14 DIAGNOSIS — F332 Major depressive disorder, recurrent severe without psychotic features: Secondary | ICD-10-CM

## 2018-05-19 ENCOUNTER — Other Ambulatory Visit: Payer: Self-pay

## 2018-05-19 ENCOUNTER — Encounter (HOSPITAL_COMMUNITY): Payer: Self-pay | Admitting: Emergency Medicine

## 2018-05-19 ENCOUNTER — Emergency Department (HOSPITAL_COMMUNITY)
Admission: EM | Admit: 2018-05-19 | Discharge: 2018-05-19 | Disposition: A | Discharge status: Other Acute Inpatient Hospital | Payer: 59 | Attending: Emergency Medicine | Admitting: Emergency Medicine

## 2018-05-19 DIAGNOSIS — R45851 Suicidal ideations: Secondary | ICD-10-CM | POA: Insufficient documentation

## 2018-05-19 DIAGNOSIS — Z79899 Other long term (current) drug therapy: Secondary | ICD-10-CM | POA: Diagnosis not present

## 2018-05-19 DIAGNOSIS — F329 Major depressive disorder, single episode, unspecified: Secondary | ICD-10-CM | POA: Insufficient documentation

## 2018-05-19 DIAGNOSIS — F17210 Nicotine dependence, cigarettes, uncomplicated: Secondary | ICD-10-CM | POA: Insufficient documentation

## 2018-05-19 DIAGNOSIS — F32A Depression, unspecified: Secondary | ICD-10-CM

## 2018-05-19 DIAGNOSIS — I10 Essential (primary) hypertension: Secondary | ICD-10-CM | POA: Insufficient documentation

## 2018-05-19 DIAGNOSIS — K219 Gastro-esophageal reflux disease without esophagitis: Secondary | ICD-10-CM | POA: Diagnosis not present

## 2018-05-19 LAB — ACETAMINOPHEN LEVEL: Acetaminophen (Tylenol), Serum: <10 ug/mL — ABNORMAL LOW (ref 10–30)

## 2018-05-19 LAB — RAPID URINE DRUG SCREEN, HOSP PERFORMED
Amphetamines: NOT DETECTED
Barbiturates: NOT DETECTED
Benzodiazepines: NOT DETECTED
Cocaine: NOT DETECTED
OPIATES: NOT DETECTED
Tetrahydrocannabinol: NOT DETECTED

## 2018-05-19 LAB — CBC
HCT: 43.2 % (ref 36.0–46.0)
HEMOGLOBIN: 13.9 g/dL (ref 12.0–15.0)
MCH: 30.1 pg (ref 26.0–34.0)
MCHC: 32.2 g/dL (ref 30.0–36.0)
MCV: 93.5 fL (ref 80.0–100.0)
Platelets: 355 10*3/uL (ref 150–400)
RBC: 4.62 MIL/uL (ref 3.87–5.11)
RDW: 13.4 % (ref 11.5–15.5)
WBC: 10 10*3/uL (ref 4.0–10.5)
nRBC: 0 % (ref 0.0–0.2)

## 2018-05-19 LAB — BASIC METABOLIC PANEL
Anion gap: 9 (ref 5–15)
BUN: 10 mg/dL (ref 6–20)
CO2: 23 mmol/L (ref 22–32)
Calcium: 8.9 mg/dL (ref 8.9–10.3)
Chloride: 105 mmol/L (ref 98–111)
Creatinine, Ser: 1.02 mg/dL — ABNORMAL HIGH (ref 0.44–1.00)
Glucose, Bld: 144 mg/dL — ABNORMAL HIGH (ref 70–99)
Potassium: 4.5 mmol/L (ref 3.5–5.1)
Sodium: 137 mmol/L (ref 135–145)

## 2018-05-19 LAB — SALICYLATE LEVEL

## 2018-05-19 LAB — ETHANOL: Alcohol, Ethyl (B): <10 mg/dL (ref <10)

## 2018-05-19 MED ORDER — PANTOPRAZOLE SODIUM 40 MG PO TBEC
40.0000 mg | DELAYED_RELEASE_TABLET | Freq: Every day | ORAL | Status: DC
  Administered 2018-05-19: 40 mg via ORAL
  Filled 2018-05-19: qty 1

## 2018-05-19 MED ORDER — LORATADINE 10 MG PO TABS
10.0000 mg | ORAL_TABLET | Freq: Every day | ORAL | Status: DC
  Administered 2018-05-19: 10 mg via ORAL
  Filled 2018-05-19: qty 1
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
## 2018-05-19 NOTE — ED Notes (Signed)
Pt ambulatory to bathroom and back to room - noted to be tearful. States Claritin and Protonix "won't help me. I told them I have to have Zyrtec and Prilosec". Advised pt of hospital substitution. Pt asking for new pair of socks each time she ambulates from room. Gave pt new pair and advised she may use the other pair for when she ambulates to bathroom - that a new pair will not be given each time. Voiced understanding.

## 2018-05-19 NOTE — ED Notes (Signed)
TTS completed. Daughter has left at this time and took all of pt's belongings.

## 2018-05-19 NOTE — ED Notes (Signed)
Pt voices understanding and agreement w/tx plan - accepted to [facility]. Pt to call daughter to advise her of this.

## 2018-05-19 NOTE — ED Notes (Signed)
Pt talking on phone w/her daughter.

## 2018-05-19 NOTE — ED Notes (Addendum)
Pt voiced understanding and agreement w/tx plan - meets Inpt Criteria. Pt lying on bed watching tv. Pt noted to be wearing eyeglasses. Staffing Office aware of need for Sitter.

## 2018-05-19 NOTE — ED Notes (Signed)
Pt given [place] sandwich and Sprite as requested.

## 2018-05-19 NOTE — ED Notes (Addendum)
Lying on bed watching tv. 

## 2018-05-19 NOTE — ED Triage Notes (Signed)
Pt arrives to the ED from home. Pt states she has been seeing a psychiatrists since June for PTSD, depression, and anxiety. Pt states she had gotten better and psychiatrist was going to send her to outpatient facility to learn coping skills. Pt states now she is feeling worse because recently she lost all her belongings. Psychiatrist told her to come here to check herself in.Pt states she has been off her meds since November because she cant afford them. Pt states she is having thoughts of hurting herself. Pt states she would slit her wrist to harm herself.
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
## 2018-05-19 NOTE — Progress Notes (Signed)
Pt accepted to [facility]; [address] Brooks Sailors is the accepting provider Call report to [phone] Wells Guiles @ [facility] ED notified.   Pt is voluntary and can be transported by [employer].    Pt may arrive to [facility] as soon as transportation can be arranged.   Audree Camel, LCSW, [place] Disposition [facility]/TTS [phone]

## 2018-05-19 NOTE — ED Notes (Signed)
Pt ambulatory to Rm 48 - wearing burgundy scrubs - daughter w/pt. Pt's belongings - 2 labeled belongings bags - placed at nurses' desk for daughter to take home if pt meets Inpt Criteria.

## 2018-05-19 NOTE — Progress Notes (Signed)
Pt meets inpatient criteria per Priscille Loveless, NP. Referral information has been sent to the following hospitals for review: [facility]   Disposition will continue to assist with inpatient placement needs.   Audree Camel, LCSW, [place] Disposition [facility]/TTS [phone]

## 2018-05-19 NOTE — ED Notes (Signed)
TTS being performed.  
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
## 2018-06-04 DIAGNOSIS — F17200 Nicotine dependence, unspecified, uncomplicated: Secondary | ICD-10-CM | POA: Diagnosis not present

## 2018-08-18 DIAGNOSIS — F17210 Nicotine dependence, cigarettes, uncomplicated: Secondary | ICD-10-CM | POA: Diagnosis not present

## 2018-08-18 DIAGNOSIS — K219 Gastro-esophageal reflux disease without esophagitis: Secondary | ICD-10-CM | POA: Diagnosis not present

## 2018-08-21 ENCOUNTER — Encounter: Payer: Self-pay | Admitting: Gastroenterology

## 2018-09-17 DIAGNOSIS — Z6835 Body mass index (BMI) 35.0-35.9, adult: Secondary | ICD-10-CM | POA: Diagnosis not present

## 2018-09-17 DIAGNOSIS — K219 Gastro-esophageal reflux disease without esophagitis: Secondary | ICD-10-CM | POA: Diagnosis not present

## 2018-10-12 DIAGNOSIS — N898 Other specified noninflammatory disorders of vagina: Secondary | ICD-10-CM | POA: Diagnosis not present

## 2018-10-12 DIAGNOSIS — Z6835 Body mass index (BMI) 35.0-35.9, adult: Secondary | ICD-10-CM | POA: Diagnosis not present

## 2018-10-12 DIAGNOSIS — R350 Frequency of micturition: Secondary | ICD-10-CM | POA: Diagnosis not present

## 2018-10-12 DIAGNOSIS — Z299 Encounter for prophylactic measures, unspecified: Secondary | ICD-10-CM | POA: Diagnosis not present

## 2018-12-22 ENCOUNTER — Other Ambulatory Visit: Payer: Self-pay

## 2018-12-22 DIAGNOSIS — Z20822 Contact with and (suspected) exposure to covid-19: Secondary | ICD-10-CM

## 2018-12-26 LAB — NOVEL CORONAVIRUS, NAA: SARS-CoV-2, NAA: NOT DETECTED

## 2019-01-25 NOTE — Congregational Nurse Program (Signed)
  Dept: [phone]   Congregational Nurse Program Note  Date of Encounter: 01/25/2019  Past Medical History: Past Medical History:  Diagnosis Date  . Allergy   . Complication of anesthesia    waking up too soon; severe itching and rash  . Depression   . GERD (gastroesophageal reflux disease)   . Plantar fasciitis   . PONV (postoperative nausea and vomiting)     Encounter Details: CNP Questionnaire - 01/25/19 1429      Questionnaire   Patient Status  Not Applicable    Race  White or Caucasian    Location Patient Served At  [facility]  Not Applicable    Uninsured  Uninsured (NEW 1x/quarter)    Food  Within past 12 months, worried food would run out with no money to buy more;Yes, have food insecurities    Housing/Utilities  Worried about losing housing;Yes, have permanent housing    Transportation  No transportation needs    Medication  Yes, have medication insecurities    Medical Provider  No    Referrals  Medication Assistance;[facility]/Care Connects;Primary Care Provider/Clinic;Behavioral/Mental Health Provider    ED Visit Averted  Not Applicable    Life-Saving Intervention Made  Not Applicable      client came into [facility] to apply for Care Connect. Client is being referred by her choice to [facility] and appointment secured for 02/04/19 at [place] noted client had medications bottles with her.  RN had not been asked to provide a medical screening.  Inquired to client if she is out of any of her medications?  Client reported the two she has with her she is almost out of. Asked client if she had been on others that she is out of. She reports blood pressure medication, but states she has been "good with my blood pressure since I'm not working at [employer] any longer."  Made client aware that we can offer a brief medical screening if she wanted. She states she will allow me to check her blood pressure.    Medications  that she has with her today are Abilify Met amphetamine that she says is prescribed for her ADHD  Informed client that [facility] will most likely recommend her to follow up with a mental health provider for those medications and treatment. Discussed [facility] and walk in intakes for establishing care. She states no to that she knows about [facility] and has used [facility] in [place] in the past and would prefer them.  Client's blood pressure today  140/85 and pulse 95. Discussed with client normal parameters  Will plan to follow up with client as Case Manager for [facility] given, encouraged client to wear a mask and to take any medications she has to her appointment with her. She states understanding.   Client had a negative COVID 19 screener at the door prior to enrollment performed by this RN.  Debria Garret RN

## 2019-02-04 ENCOUNTER — Telehealth: Payer: Self-pay

## 2019-02-04 ENCOUNTER — Ambulatory Visit: Payer: Self-pay | Admitting: Physician Assistant

## 2019-02-04 ENCOUNTER — Other Ambulatory Visit: Payer: Self-pay

## 2019-02-04 ENCOUNTER — Encounter: Payer: Self-pay | Admitting: Physician Assistant

## 2019-02-04 VITALS — Temp 100.0°F

## 2019-02-04 DIAGNOSIS — F329 Major depressive disorder, single episode, unspecified: Secondary | ICD-10-CM

## 2019-02-04 DIAGNOSIS — R059 Cough, unspecified: Secondary | ICD-10-CM

## 2019-02-04 DIAGNOSIS — R509 Fever, unspecified: Secondary | ICD-10-CM

## 2019-02-04 DIAGNOSIS — Z20822 Contact with and (suspected) exposure to covid-19: Secondary | ICD-10-CM

## 2019-02-04 DIAGNOSIS — R05 Cough: Secondary | ICD-10-CM

## 2019-02-04 DIAGNOSIS — F32A Depression, unspecified: Secondary | ICD-10-CM

## 2019-02-04 DIAGNOSIS — Z7689 Persons encountering health services in other specified circumstances: Secondary | ICD-10-CM

## 2019-02-04 DIAGNOSIS — E785 Hyperlipidemia, unspecified: Secondary | ICD-10-CM

## 2019-02-04 MED ORDER — ALBUTEROL SULFATE HFA 108 (90 BASE) MCG/ACT IN AERS: 2.0000 | INHALATION_SPRAY | Freq: Four times a day (QID) | RESPIRATORY_TRACT | 1 refills | Status: DC | PRN

## 2019-02-04 NOTE — Progress Notes (Signed)
There were no vitals taken for this visit.   Subjective:    Patient ID: Lorraine Kelly, female    DOB: 16-Feb-1966, 53 y.o.   MRN: [id number]  HPI: Lorraine Kelly is a 53 y.o. female presenting on 02/04/2019 for No chief complaint on file.   HPI    This is a telemedicine appointment through [employer] due to coronavirus pandemic.  Pt was scheduled to come into the office for in-person appointment but she failed covid-19 screening questionnaire so appointment was changed to telemedicine appointment.    I connected with  Lorraine Kelly on 02/04/19 by a video enabled telemedicine application and verified that I am speaking with the correct person using two identifiers.   I discussed the limitations of evaluation and management by telemedicine. The patient expressed understanding and agreed to proceed.  Pt is in her car (parked in front of office).  Provider is at office.      Temp 100  Today during screening Pt reports having a Cough for 2 weeks.  She states No sob.  Pt says no fever yesterday.  Pt works cleaning houses.   She used to work at [employer] but hasn't been there since 2019  Pt is a smoker.    Pt is very focused on getting her amphetamine Rx  Pt states Her adderral rx was by Arsenio Katz at [facility] internal medicine.  She says Her abilify was rx by [facility] which is a hospital in [place] where she was an inpatient in December.      She has been going to [facility] in [place] for refills on the abillify until a few months ago.    She says she had history hyperlipdemia and she started medication for it but stopped it due to it made her "stomach burn"  Review of notes in Epic show that pt has had some elevated blood pressure readings as well (in the 140 range)   Relevant past medical, surgical, family and social history reviewed and updated as indicated. Interim medical history since our last visit reviewed. Allergies and medications reviewed and updated.   Current Outpatient  Medications:  .  Amphetamine-Dextroamphetamine (ADDERALL PO), Take by mouth., Disp: , Rfl:  .  ARIPiprazole (ABILIFY PO), Take by mouth., Disp: , Rfl:  .  cetirizine (ZYRTEC) 10 MG tablet, Take 1 tablet (10 mg total) by mouth daily., Disp: 90 tablet, Rfl: 3 .  omeprazole (PRILOSEC) 20 MG capsule, Take 20 mg by mouth daily., Disp: , Rfl:      Review of Systems  Per HPI unless specifically indicated above     Objective:    There were no vitals taken for this visit.  Wt Readings from Last 3 Encounters:  05/19/18 217 lb (98.4 kg)  11/19/17 202 lb 0.6 oz (91.6 kg)  11/12/17 200 lb (90.7 kg)     Vitals:   02/04/19 1324  Temp: 100 F (37.8 C)     Physical Exam Constitutional:      General: She is not in acute distress.    Appearance: She is obese.  HENT:     Head: Normocephalic and atraumatic.  Pulmonary:     Effort: No respiratory distress.  Neurological:     Mental Status: She is alert and oriented to person, place, and time.  Psychiatric:        Attention and Perception: Attention normal.        Mood and Affect: Affect is angry.  Behavior: Behavior is cooperative.            Assessment & Plan:    Encounter Diagnoses  Name Primary?  . Encounter to establish care Yes  . Cough   . Fever, unspecified fever cause   . Suspected Covid-19 Virus Infection   . Depression, unspecified depression type   . Hyperlipidemia, unspecified hyperlipidemia type        -pt is counseled to [facility] for treatment of mental health issues.  She is given the contact information  -pt to get CoVid19 test now.  Pt counseled to self-quarantiine  -Rx for Albuterol MDI sent to [facility] (pt instructed to use drive-thru pick up to avoid coming into contact with other people)  -Will schedule Follow up appointment when call with results of coronavirus testing.  Pt will need to update routine labs as well.  Pt states understanding of plan and has no further questions

## 2019-02-04 NOTE — Telephone Encounter (Signed)
Called client to follow up after [facility] appointment. No answer. Left voicemail.  Debria Garret RN

## 2019-02-06 LAB — NOVEL CORONAVIRUS, NAA: SARS-CoV-2, NAA: NOT DETECTED

## 2019-02-11 ENCOUNTER — Encounter: Payer: Self-pay | Admitting: Physician Assistant

## 2019-02-11 ENCOUNTER — Ambulatory Visit: Payer: Self-pay | Admitting: Physician Assistant

## 2019-02-11 DIAGNOSIS — Z131 Encounter for screening for diabetes mellitus: Secondary | ICD-10-CM

## 2019-02-11 DIAGNOSIS — F17200 Nicotine dependence, unspecified, uncomplicated: Secondary | ICD-10-CM

## 2019-02-11 DIAGNOSIS — F32A Depression, unspecified: Secondary | ICD-10-CM

## 2019-02-11 DIAGNOSIS — J209 Acute bronchitis, unspecified: Secondary | ICD-10-CM

## 2019-02-11 DIAGNOSIS — F329 Major depressive disorder, single episode, unspecified: Secondary | ICD-10-CM

## 2019-02-11 DIAGNOSIS — E785 Hyperlipidemia, unspecified: Secondary | ICD-10-CM

## 2019-02-11 MED ORDER — AZITHROMYCIN 250 MG PO TABS: ORAL_TABLET | ORAL | 0 refills | Status: DC

## 2019-02-11 MED ORDER — ALBUTEROL SULFATE HFA 108 (90 BASE) MCG/ACT IN AERS: 2.0000 | INHALATION_SPRAY | Freq: Four times a day (QID) | RESPIRATORY_TRACT | 0 refills | Status: DC | PRN

## 2019-02-11 NOTE — Progress Notes (Signed)
There were no vitals taken for this visit.   Subjective:    Patient ID: Lorraine Kelly, female    DOB: 08-30-1965, 53 y.o.   MRN: [id number]  HPI: Lorraine Kelly is a 53 y.o. female presenting on 02/11/2019 for No chief complaint on file.   HPI   This is a telemedicine appointment through [employer] due to coronavirus pandemic.  I connected with  [name] on 02/11/19 by a video enabled telemedicine application and verified that I am speaking with the correct person using two identifiers.   I discussed the limitations of evaluation and management by telemedicine. The patient expressed understanding and agreed to proceed.  Pt is at home.  Provider is at office.     She didn't get inhaler from pharmacy that was prescribed for her on 02/04/19.  Pt did get covid 19 testing at that time which was negative.   She is still coughing.  She is wheezing.  She is SOB.  She reports Some Sore Throat but says that is mostly better.  No EarAche.  No fever.  Some diarhea and nausea.  No emesis.   She says she always gets a case of bronchitis this time of year.  She continues to smoke.   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     Pt says she is signed up for [facility].       Relevant past medical, surgical, family and social history reviewed and updated as indicated. Interim medical history since our last visit reviewed. Allergies and medications reviewed and updated.    Current Outpatient Medications:  .  cetirizine (ZYRTEC) 10 MG tablet, Take 1 tablet (10 mg total) by mouth daily., Disp: 90 tablet, Rfl: 3 .  omeprazole (PRILOSEC) 20 MG capsule, Take 20 mg by mouth daily., Disp: , Rfl:  .  albuterol (VENTOLIN HFA) 108 (90 Base) MCG/ACT inhaler, Inhale 2 puffs into the lungs every 6 (six) hours as needed for wheezing or shortness of breath. (Patient not taking: Reported on 02/11/2019), Disp: 18 g, Rfl: 1 .  Amphetamine-Dextroamphetamine (ADDERALL PO), Take by mouth., Disp: , Rfl:   .  ARIPiprazole (ABILIFY PO), Take by mouth., Disp: , Rfl:   Review of Systems  Per HPI unless specifically indicated above     Objective:    There were no vitals taken for this visit.  Wt Readings from Last 3 Encounters:  05/19/18 217 lb (98.4 kg)  11/19/17 202 lb 0.6 oz (91.6 kg)  11/12/17 200 lb (90.7 kg)    Physical Exam Constitutional:      General: She is not in acute distress.    Appearance: She is obese. She is not ill-appearing.  HENT:     Head: Normocephalic and atraumatic.  Pulmonary:     Effort: Pulmonary effort is normal. No respiratory distress.     Comments: No wheezing heard.  No sob seen.  Neurological:     Mental Status: She is alert and oriented to person, place, and time.  Psychiatric:        Attention and Perception: Attention normal.        Speech: Speech normal.        Behavior: Behavior is cooperative.     Results for orders placed or performed in visit on 02/04/19  Novel Coronavirus, NAA ([facility])   Specimen: Nasopharyngeal(NP) swabs in vial transport medium  Result Value Ref Range   SARS-CoV-2, NAA Not Detected Not Detected      Assessment &  Plan:   Encounter Diagnoses  Name Primary?  . Acute bronchitis, unspecified organism Yes  . Tobacco use disorder   . Depression, unspecified depression type   . Hyperlipidemia, unspecified hyperlipidemia type   . Screening for diabetes mellitus       -rx zpack and albuterol mdi.  rx sent to [facility] per pt request.  She is aware that she will not get her medications for a week as it is mail order.  She declines to have medications sent to local pharmacy. -pt encouraged to Avoid smoking -pt to follow up 1 month as scheduled.  She is encouraged to get fasting labs drawn before appointment.  She is to contact office sooner prn worsening or new symptoms

## 2019-03-16 ENCOUNTER — Other Ambulatory Visit: Payer: Self-pay

## 2019-03-16 ENCOUNTER — Encounter: Payer: Self-pay | Admitting: Physician Assistant

## 2019-03-16 ENCOUNTER — Ambulatory Visit: Payer: 59 | Admitting: Physician Assistant

## 2019-03-16 VITALS — BP 119/72 | HR 102 | Temp 99.0°F | Ht 67.0 in | Wt 224.0 lb

## 2019-03-16 DIAGNOSIS — F17200 Nicotine dependence, unspecified, uncomplicated: Secondary | ICD-10-CM

## 2019-03-16 DIAGNOSIS — N951 Menopausal and female climacteric states: Secondary | ICD-10-CM

## 2019-03-16 DIAGNOSIS — F39 Unspecified mood [affective] disorder: Secondary | ICD-10-CM

## 2019-03-16 DIAGNOSIS — Z1239 Encounter for other screening for malignant neoplasm of breast: Secondary | ICD-10-CM

## 2019-03-16 DIAGNOSIS — E669 Obesity, unspecified: Secondary | ICD-10-CM

## 2019-03-16 NOTE — Progress Notes (Signed)
BP 119/72   Pulse (!) 102   Temp 99 F (37.2 C)   Ht 5\' 7"  (1.702 m)   Wt 224 lb (101.6 kg)   SpO2 97%   BMI 35.08 kg/m    Subjective:    Patient ID: Lorraine Kelly, female    DOB: 10-03-1965, 53 y.o.   MRN: [id number]  HPI: Lorraine Kelly is a 53 y.o. female presenting on 03/16/2019 for No chief complaint on file.   HPI    Pt had a negative covid 19 screening questionnaire   Pt did not get her labs drawn  She dd not contact [facility] as recommended for MH issues.    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  She Asks about HRT.  She was on it in the past but stopped it due to cost.     Relevant past medical, surgical, family and social history reviewed and updated as indicated. Interim medical history since our last visit reviewed. Allergies and medications reviewed and updated.   CURRENT MEDS: Albuterol Zyrtec Omeprazole      Review of Systems  Per HPI unless specifically indicated above     Objective:    BP 119/72   Pulse (!) 102   Temp 99 F (37.2 C)   Ht 5\' 7"  (1.702 m)   Wt 224 lb (101.6 kg)   SpO2 97%   BMI 35.08 kg/m   Wt Readings from Last 3 Encounters:  03/16/19 224 lb (101.6 kg)  05/19/18 217 lb (98.4 kg)  11/19/17 202 lb 0.6 oz (91.6 kg)    Physical Exam Vitals signs reviewed.  Constitutional:      Appearance: She is well-developed. She is obese.  HENT:     Head: Normocephalic and atraumatic.  Neck:     Musculoskeletal: Neck supple.  Cardiovascular:     Rate and Rhythm: Normal rate and regular rhythm.  Pulmonary:     Effort: Pulmonary effort is normal.     Breath sounds: Normal breath sounds.  Abdominal:     General: Bowel sounds are normal.     Palpations: Abdomen is soft. There is no mass.     Tenderness: There is no abdominal tenderness.  Musculoskeletal:     Right lower leg: No edema.     Left lower leg: No edema.  Lymphadenopathy:     Cervical: No  cervical adenopathy.  Skin:    General: Skin is warm and dry.  Neurological:     Mental Status: She is alert and oriented to person, place, and time.  Psychiatric:        Attention and Perception: Attention normal.        Speech: Speech normal.        Behavior: Behavior normal. Behavior is cooperative.     Comments: Pt much calmer today and is very pleasant.          Assessment & Plan:    Encounter Diagnoses  Name Primary?  . Tobacco use disorder Yes  . Obesity, unspecified classification, unspecified obesity type, unspecified whether serious comorbidity present   . Mood disorder ([place])   . Encounter for screening for malignant neoplasm of breast, unspecified screening modality   . Menopausal symptoms      -will refer for screening mammogram in November  -pt to get fasting Labs. She will be called with results  -discussed alternates to HRT for menopausal symptoms as she  is a smoker she will not get HRT here.  Discussed SSRIs which she declines with her history of medication for MH. Discussed soy products and she will try that  -Pap and colonoscopy UTD  -encouraged smoking cessation  -pt encouraged to contact [facility]  -pt to follow up 6 months.  She is to contact office sooner prn

## 2019-04-27 ENCOUNTER — Other Ambulatory Visit: Payer: Self-pay | Admitting: *Deleted

## 2019-04-27 DIAGNOSIS — Z20822 Contact with and (suspected) exposure to covid-19: Secondary | ICD-10-CM

## 2019-04-28 LAB — NOVEL CORONAVIRUS, NAA: SARS-CoV-2, NAA: NOT DETECTED

## 2019-05-25 ENCOUNTER — Other Ambulatory Visit: Payer: Self-pay | Admitting: Physician Assistant

## 2019-05-25 DIAGNOSIS — Z1239 Encounter for other screening for malignant neoplasm of breast: Secondary | ICD-10-CM

## 2019-06-02 ENCOUNTER — Other Ambulatory Visit: Payer: Self-pay

## 2019-06-02 ENCOUNTER — Ambulatory Visit (HOSPITAL_COMMUNITY)
Admission: RE | Admit: 2019-06-02 | Discharge: 2019-06-02 | Disposition: A | Discharge status: Home / Self Care | Payer: Self-pay | Source: Ambulatory Visit | Attending: Physician Assistant | Admitting: Physician Assistant

## 2019-06-02 DIAGNOSIS — Z1239 Encounter for other screening for malignant neoplasm of breast: Secondary | ICD-10-CM

## 2019-06-21 ENCOUNTER — Ambulatory Visit: Payer: Self-pay | Attending: Internal Medicine

## 2019-06-21 ENCOUNTER — Other Ambulatory Visit: Payer: Self-pay

## 2019-06-21 DIAGNOSIS — Z20822 Contact with and (suspected) exposure to covid-19: Secondary | ICD-10-CM | POA: Insufficient documentation

## 2019-06-23 LAB — NOVEL CORONAVIRUS, NAA: SARS-CoV-2, NAA: NOT DETECTED

## 2019-08-21 ENCOUNTER — Other Ambulatory Visit: Payer: Self-pay | Admitting: Physician Assistant

## 2019-08-21 DIAGNOSIS — Z131 Encounter for screening for diabetes mellitus: Secondary | ICD-10-CM

## 2019-08-21 DIAGNOSIS — E785 Hyperlipidemia, unspecified: Secondary | ICD-10-CM

## 2019-08-26 ENCOUNTER — Encounter: Payer: Self-pay | Admitting: Physician Assistant

## 2019-08-26 ENCOUNTER — Ambulatory Visit: Payer: Self-pay | Admitting: Physician Assistant

## 2019-08-26 DIAGNOSIS — N950 Postmenopausal bleeding: Secondary | ICD-10-CM

## 2019-08-26 NOTE — Progress Notes (Signed)
There were no vitals taken for this visit.   Subjective:    Patient ID: Lorraine Kelly, female    DOB: 02/09/1966, 54 y.o.   MRN: [id number]  HPI: Lorraine Kelly is a 54 y.o. female presenting on 08/26/2019 for No chief complaint on file.   HPI   This is a telemedicine appointment through [employer] due to coronavirus pandemic.  I connected with  Lorraine Kelly on 08/26/19 by a video enabled telemedicine application and verified that I am speaking with the correct person using two identifiers.   I discussed the limitations of evaluation and management by telemedicine. The patient expressed understanding and agreed to proceed.  Pt is at home.  Provider is working from home office.   Pt called for work-in appointment due to pubic area soreness.  Pt states soreness genital area.  she thinks it may be due to increased walking at work.  She denies redness, swelling, skin lesions.  She says she is just sore.  She denies and injury and hasn't been sexually active lately.    Pt also concerned due to she started spotting 3 days ago.   She has been in menopause with LMP approx  1 1/2 years ago.     No recent weight gain  Pt Wears leggings or loose jeans to work.  Last PAP 02/17/17 normal with negative HPV.        Relevant past medical, surgical, family and social history reviewed and updated as indicated. Interim medical history since our last visit reviewed. Allergies and medications reviewed and updated.    Current Outpatient Medications:  .  aspirin EC 81 MG tablet, Take 81 mg by mouth daily., Disp: , Rfl:  .  cetirizine (ZYRTEC) 10 MG tablet, Take 1 tablet (10 mg total) by mouth daily., Disp: 90 tablet, Rfl: 3 .  Multiple Vitamin (MULTIVITAMIN) capsule, Take 1 capsule by mouth daily., Disp: , Rfl:  .  omeprazole (PRILOSEC) 20 MG capsule, Take 20 mg by mouth daily., Disp: , Rfl:  .  Probiotic Product (PROBIOTIC DAILY PO), Take by mouth., Disp: , Rfl:  .  albuterol (VENTOLIN HFA) 108  (90 Base) MCG/ACT inhaler, Inhale 2 puffs into the lungs every 6 (six) hours as needed for wheezing or shortness of breath. (Patient not taking: Reported on 08/26/2019), Disp: 18 g, Rfl: 0 .  Amphetamine-Dextroamphetamine (ADDERALL PO), Take by mouth., Disp: , Rfl:  .  ARIPiprazole (ABILIFY PO), Take by mouth., Disp: , Rfl:      Review of Systems  Per HPI unless specifically indicated above     Objective:    There were no vitals taken for this visit.  Wt Readings from Last 3 Encounters:  03/16/19 224 lb (101.6 kg)  05/19/18 217 lb (98.4 kg)  11/19/17 202 lb 0.6 oz (91.6 kg)    Physical Exam Constitutional:      General: She is not in acute distress.    Appearance: She is obese. She is not ill-appearing.  HENT:     Head: Normocephalic and atraumatic.  Pulmonary:     Effort: Pulmonary effort is normal. No respiratory distress.  Neurological:     Mental Status: She is alert and oriented to person, place, and time.  Psychiatric:        Attention and Perception: Attention normal.        Speech: Speech normal.        Behavior: Behavior is cooperative.  Assessment & Plan:   Encounter Diagnosis  Name Primary?  . Post-menopausal bleeding Yes    -pt is Mailed CAFA / application for [facility] financial assistance -Refer to gyn for evaluation of post menopausal bleeding -encouraged pt to soak for 15 -20 minutes after returning home from work and taking OTC analgesic if needed for soreness -discussed with pt taht she has still not gotten baseline labs that were ordered in September and that she will need to have those drawn prior to her follow up appointment in April.   -pt in agreement with plan

## 2019-09-01 ENCOUNTER — Other Ambulatory Visit: Payer: Self-pay

## 2019-09-01 ENCOUNTER — Ambulatory Visit: Payer: Self-pay | Attending: Internal Medicine

## 2019-09-01 DIAGNOSIS — T7632XA Child psychological abuse, suspected, initial encounter: Secondary | ICD-10-CM

## 2019-09-01 DIAGNOSIS — Z20822 Contact with and (suspected) exposure to covid-19: Secondary | ICD-10-CM | POA: Insufficient documentation

## 2019-09-02 LAB — NOVEL CORONAVIRUS, NAA: SARS-CoV-2, NAA: NOT DETECTED

## 2019-09-02 LAB — SARS-COV-2, NAA 2 DAY TAT

## 2019-09-10 ENCOUNTER — Other Ambulatory Visit (HOSPITAL_COMMUNITY)
Admission: RE | Admit: 2019-09-10 | Discharge: 2019-09-10 | Disposition: A | Discharge status: Home / Self Care | Payer: Self-pay | Source: Ambulatory Visit | Attending: Physician Assistant | Admitting: Physician Assistant

## 2019-09-10 DIAGNOSIS — Z131 Encounter for screening for diabetes mellitus: Secondary | ICD-10-CM | POA: Insufficient documentation

## 2019-09-10 DIAGNOSIS — E785 Hyperlipidemia, unspecified: Secondary | ICD-10-CM | POA: Insufficient documentation

## 2019-09-10 LAB — LIPID PANEL
Cholesterol: 250 mg/dL — ABNORMAL HIGH (ref 0–200)
HDL: 40 mg/dL — ABNORMAL LOW (ref >40)
LDL Cholesterol: 151 mg/dL — ABNORMAL HIGH (ref 0–99)
Total CHOL/HDL Ratio: 6.3 RATIO
Triglycerides: 295 mg/dL — ABNORMAL HIGH (ref <150)
VLDL: 59 mg/dL — ABNORMAL HIGH (ref 0–40)

## 2019-09-10 LAB — COMPREHENSIVE METABOLIC PANEL
ALT: 35 U/L (ref 0–44)
AST: 30 U/L (ref 15–41)
Albumin: 4.1 g/dL (ref 3.5–5.0)
Alkaline Phosphatase: 76 U/L (ref 38–126)
Anion gap: 9 (ref 5–15)
BUN: 11 mg/dL (ref 6–20)
CO2: 26 mmol/L (ref 22–32)
Calcium: 9.2 mg/dL (ref 8.9–10.3)
Chloride: 101 mmol/L (ref 98–111)
Creatinine, Ser: 0.77 mg/dL (ref 0.44–1.00)
GFR calc Af Amer: >60 mL/min (ref >60)
GFR calc non Af Amer: >60 mL/min (ref >60)
Glucose, Bld: 113 mg/dL — ABNORMAL HIGH (ref 70–99)
Potassium: 4.2 mmol/L (ref 3.5–5.1)
Sodium: 136 mmol/L (ref 135–145)
Total Bilirubin: 0.5 mg/dL (ref 0.3–1.2)
Total Protein: 7.7 g/dL (ref 6.5–8.1)

## 2019-09-10 LAB — HEMOGLOBIN A1C
Hgb A1c MFr Bld: 6.9 % — ABNORMAL HIGH (ref 4.8–5.6)
Mean Plasma Glucose: 151.33 mg/dL

## 2019-09-14 ENCOUNTER — Ambulatory Visit: Payer: 59 | Admitting: Physician Assistant

## 2019-09-14 ENCOUNTER — Encounter: Payer: Self-pay | Admitting: Physician Assistant

## 2019-09-14 ENCOUNTER — Other Ambulatory Visit: Payer: Self-pay

## 2019-09-14 VITALS — BP 118/66 | HR 98 | Temp 98.8°F | Ht 67.0 in | Wt 227.0 lb

## 2019-09-14 DIAGNOSIS — F17200 Nicotine dependence, unspecified, uncomplicated: Secondary | ICD-10-CM

## 2019-09-14 DIAGNOSIS — Z5320 Procedure and treatment not carried out because of patient's decision for unspecified reasons: Secondary | ICD-10-CM

## 2019-09-14 DIAGNOSIS — E669 Obesity, unspecified: Secondary | ICD-10-CM

## 2019-09-14 DIAGNOSIS — E785 Hyperlipidemia, unspecified: Secondary | ICD-10-CM

## 2019-09-14 DIAGNOSIS — E119 Type 2 diabetes mellitus without complications: Secondary | ICD-10-CM

## 2019-09-14 MED ORDER — METFORMIN HCL ER 500 MG PO TB24: 500.0000 mg | ORAL_TABLET | Freq: Every day | ORAL | 4 refills | Status: DC

## 2019-09-14 NOTE — Progress Notes (Signed)
BP 118/66   Pulse 98   Temp 98.8 F (37.1 C)   Ht 5\' 7"  (1.702 m)   Wt 227 lb (103 kg)   BMI 35.55 kg/m    Subjective:    Patient ID: Lorraine Kelly, female    DOB: 1965/06/16, 54 y.o.   MRN: [id number]  HPI: Lorraine Kelly is a 54 y.o. female presenting on 09/14/2019 for No chief complaint on file.   HPI   Pt had a negative covid 19 screening questionnaire.   Pt is 61yoF who first came to [facility] in august 2020 but is just now getting baseline labs that were ordered at that time.    O2 saturation will not register due to nail polish  Pt was referred to gyn for post-menopausal bleeding.  She has an appointment later this week.    Pt Hasn't submittted CAFA / application for financial assistance through [facility].    Pt is scheduled for her 2nd covid vaccination this wekend   Relevant past medical, surgical, family and social history reviewed and updated as indicated. Interim medical history since our last visit reviewed. Allergies and medications reviewed and updated.  CURRENT MEDS Asa 81mg  Zyrtec MVI omeprazole     Review of Systems  Per HPI unless specifically indicated above     Objective:    BP 118/66   Pulse 98   Temp 98.8 F (37.1 C)   Ht 5\' 7"  (1.702 m)   Wt 227 lb (103 kg)   BMI 35.55 kg/m   Wt Readings from Last 3 Encounters:  09/14/19 227 lb (103 kg)  03/16/19 224 lb (101.6 kg)  05/19/18 217 lb (98.4 kg)    Physical Exam Vitals reviewed.  Constitutional:      General: She is not in acute distress.    Appearance: Normal appearance. She is well-developed. She is not ill-appearing.  HENT:     Head: Normocephalic and atraumatic.  Cardiovascular:     Rate and Rhythm: Normal rate and regular rhythm.  Pulmonary:     Effort: Pulmonary effort is normal.     Breath sounds: Normal breath sounds.  Abdominal:     General: Bowel sounds are normal.     Palpations: Abdomen is soft. There is no mass.     Tenderness: There is no abdominal tenderness.   Musculoskeletal:     Cervical back: Neck supple.     Right lower leg: No edema.     Left lower leg: No edema.  Lymphadenopathy:     Cervical: No cervical adenopathy.  Skin:    General: Skin is warm and dry.  Neurological:     Mental Status: She is alert and oriented to person, place, and time.  Psychiatric:        Attention and Perception: Attention normal.        Speech: Speech normal.        Behavior: Behavior normal. Behavior is cooperative.     Results for orders placed or performed during the hospital encounter of 09/10/19  Lipid panel  Result Value Ref Range   Cholesterol 250 (H) 0 - 200 mg/dL   Triglycerides 295 (H) <150 mg/dL   HDL 40 (L) >40 mg/dL   Total CHOL/HDL Ratio 6.3 RATIO   VLDL 59 (H) 0 - 40 mg/dL   LDL Cholesterol 151 (H) 0 - 99 mg/dL  Comprehensive metabolic panel  Result Value Ref Range   Sodium 136 135 - 145 mmol/L   Potassium 4.2 3.5 -  5.1 mmol/L   Chloride 101 98 - 111 mmol/L   CO2 26 22 - 32 mmol/L   Glucose, Bld 113 (H) 70 - 99 mg/dL   BUN 11 6 - 20 mg/dL   Creatinine, Ser 0.77 0.44 - 1.00 mg/dL   Calcium 9.2 8.9 - 10.3 mg/dL   Total Protein 7.7 6.5 - 8.1 g/dL   Albumin 4.1 3.5 - 5.0 g/dL   AST 30 15 - 41 U/L   ALT 35 0 - 44 U/L   Alkaline Phosphatase 76 38 - 126 U/L   Total Bilirubin 0.5 0.3 - 1.2 mg/dL   GFR calc non Af Amer >60 >60 mL/min   GFR calc Af Amer >60 >60 mL/min   Anion gap 9 5 - 15  Hemoglobin A1c  Result Value Ref Range   Hgb A1c MFr Bld 6.9 (H) 4.8 - 5.6 %   Mean Plasma Glucose 151.33 mg/dL      Assessment & Plan:    Encounter Diagnoses  Name Primary?  . Diabetes mellitus without complication ([place]) Yes  . Hyperlipidemia, unspecified hyperlipidemia type   . Obesity, unspecified classification, unspecified obesity type, unspecified whether serious comorbidity present   . Statin declined   . Tobacco use disorder      -reviewed labs with pt -pt is counseled on diabetes and she is given reading information.  Will  start metformin -pt is counseled on dyslipidemia.  She was recommended to start statin but pt Declined statin.  She is encouraged to follow lowfat diet and exercise and she was given reading information on cholesterol -PAP,  mammogram , colonoscopy are up to date -encouraged smoking cessation -pt is encouraged to submit her application for [facility] [employer] financial assistance -gyn as scheduled -pt will follow up in 3 months.  She is to contact office sooner prn

## 2019-09-14 NOTE — Patient Instructions (Signed)
Diabetes Mellitus and Nutrition, Adult When you have diabetes (diabetes mellitus), it is very important to have healthy eating habits because your blood sugar (glucose) levels are greatly affected by what you eat and drink. Eating healthy foods in the appropriate amounts, at about the same times every day, can help you:  Control your blood glucose.  Lower your risk of heart disease.  Improve your blood pressure.  Reach or maintain a healthy weight. Every person with diabetes is different, and each person has different needs for a meal plan. Your health care provider may recommend that you work with a diet and nutrition specialist (dietitian) to make a meal plan that is best for you. Your meal plan may vary depending on factors such as:  The calories you need.  The medicines you take.  Your weight.  Your blood glucose, blood pressure, and cholesterol levels.  Your activity level.  Other health conditions you have, such as heart or kidney disease. How do carbohydrates affect me? Carbohydrates, also called carbs, affect your blood glucose level more than any other type of food. Eating carbs naturally raises the amount of glucose in your blood. Carb counting is a method for keeping track of how many carbs you eat. Counting carbs is important to keep your blood glucose at a healthy level, especially if you use insulin or take certain oral diabetes medicines. It is important to know how many carbs you can safely have in each meal. This is different for every person. Your dietitian can help you calculate how many carbs you should have at each meal and for each snack. Foods that contain carbs include:  Bread, cereal, rice, pasta, and crackers.  Potatoes and corn.  Peas, beans, and lentils.  Milk and yogurt.  Fruit and juice.  Desserts, such as cakes, cookies, ice cream, and candy. How does alcohol affect me? Alcohol can cause a sudden decrease in blood glucose (hypoglycemia),  especially if you use insulin or take certain oral diabetes medicines. Hypoglycemia can be a life-threatening condition. Symptoms of hypoglycemia (sleepiness, dizziness, and confusion) are similar to symptoms of having too much alcohol. If your health care provider says that alcohol is safe for you, follow these guidelines:  Limit alcohol intake to no more than 1 drink per day for nonpregnant women and 2 drinks per day for men. One drink equals 12 oz of beer, 5 oz of wine, or 1 oz of hard liquor.  Do not drink on an empty stomach.  Keep yourself hydrated with water, diet soda, or unsweetened iced tea.  Keep in mind that regular soda, juice, and other mixers may contain a lot of sugar and must be counted as carbs. What are tips for following this plan?  Reading food labels  Start by checking the serving size on the "Nutrition Facts" label of packaged foods and drinks. The amount of calories, carbs, fats, and other nutrients listed on the label is based on one serving of the item. Many items contain more than one serving per package.  Check the total grams (g) of carbs in one serving. You can calculate the number of servings of carbs in one serving by dividing the total carbs by 15. For example, if a food has 30 g of total carbs, it would be equal to 2 servings of carbs.  Check the number of grams (g) of saturated and trans fats in one serving. Choose foods that have low or no amount of these fats.  Check the number of   milligrams (mg) of salt (sodium) in one serving. Most people should limit total sodium intake to less than 2,300 mg per day.  Always check the nutrition information of foods labeled as "low-fat" or "nonfat". These foods may be higher in added sugar or refined carbs and should be avoided.  Talk to your dietitian to identify your daily goals for nutrients listed on the label. Shopping  Avoid buying canned, premade, or processed foods. These foods tend to be high in fat, sodium,  and added sugar.  Shop around the outside edge of the grocery store. This includes fresh fruits and vegetables, bulk grains, fresh meats, and fresh dairy. Cooking  Use low-heat cooking methods, such as baking, instead of high-heat cooking methods like deep frying.  Cook using healthy oils, such as olive, canola, or sunflower oil.  Avoid cooking with butter, cream, or high-fat meats. Meal planning  Eat meals and snacks regularly, preferably at the same times every day. Avoid going long periods of time without eating.  Eat foods high in fiber, such as fresh fruits, vegetables, beans, and whole grains. Talk to your dietitian about how many servings of carbs you can eat at each meal.  Eat 4-6 ounces (oz) of lean protein each day, such as lean meat, chicken, fish, eggs, or tofu. One oz of lean protein is equal to: ? 1 oz of meat, chicken, or fish. ? 1 egg. ?  cup of tofu.  Eat some foods each day that contain healthy fats, such as avocado, nuts, seeds, and fish. Lifestyle  Check your blood glucose regularly.  Exercise regularly as told by your health care provider. This may include: ? 150 minutes of moderate-intensity or vigorous-intensity exercise each week. This could be brisk walking, biking, or water aerobics. ? Stretching and doing strength exercises, such as yoga or weightlifting, at least 2 times a week.  Take medicines as told by your health care provider.  Do not use any products that contain nicotine or tobacco, such as cigarettes and e-cigarettes. If you need help quitting, ask your health care provider.  Work with a counselor or diabetes educator to identify strategies to manage stress and any emotional and social challenges. Questions to ask a health care provider  Do I need to meet with a diabetes educator?  Do I need to meet with a dietitian?  What number can I call if I have questions?  When are the best times to check my blood glucose? Where to find more  information:  American Diabetes Association: diabetes.org  Academy of Nutrition and Dietetics: www.eatright.org  National Institute of Diabetes and Digestive and Kidney Diseases (NIH): www.niddk.nih.gov Summary  A healthy meal plan will help you control your blood glucose and maintain a healthy lifestyle.  Working with a diet and nutrition specialist (dietitian) can help you make a meal plan that is best for you.  Keep in mind that carbohydrates (carbs) and alcohol have immediate effects on your blood glucose levels. It is important to count carbs and to use alcohol carefully. This information is not intended to replace advice given to you by your health care provider. Make sure you discuss any questions you have with your health care provider. Document Revised: 05/09/2017 Document Reviewed: 07/01/2016 Elsevier Patient Education  2020 Elsevier Inc.   High Cholesterol  High cholesterol is a condition in which the blood has high levels of a white, waxy, fat-like substance (cholesterol). The human body needs small amounts of cholesterol. The liver makes all the cholesterol that   the body needs. Extra (excess) cholesterol comes from the food that we eat. Cholesterol is carried from the liver by the blood through the blood vessels. If you have high cholesterol, deposits (plaques) may build up on the walls of your blood vessels (arteries). Plaques make the arteries narrower and stiffer. Cholesterol plaques increase your risk for heart attack and stroke. Work with your health care provider to keep your cholesterol levels in a healthy range. What increases the risk? This condition is more likely to develop in people who:  Eat foods that are high in animal fat (saturated fat) or cholesterol.  Are overweight.  Are not getting enough exercise.  Have a family history of high cholesterol. What are the signs or symptoms? There are no symptoms of this condition. How is this diagnosed? This  condition may be diagnosed from the results of a blood test.  If you are older than age 20, your health care provider may check your cholesterol every 4-6 years.  You may be checked more often if you already have high cholesterol or other risk factors for heart disease. The blood test for cholesterol measures:  "Bad" cholesterol (LDL cholesterol). This is the main type of cholesterol that causes heart disease. The desired level for LDL is less than 100.  "Good" cholesterol (HDL cholesterol). This type helps to protect against heart disease by cleaning the arteries and carrying the LDL away. The desired level for HDL is 60 or higher.  Triglycerides. These are fats that the body can store or burn for energy. The desired number for triglycerides is lower than 150.  Total cholesterol. This is a measure of the total amount of cholesterol in your blood, including LDL cholesterol, HDL cholesterol, and triglycerides. A healthy number is less than 200. How is this treated? This condition is treated with diet changes, lifestyle changes, and medicines. Diet changes  This may include eating more whole grains, fruits, vegetables, nuts, and fish.  This may also include cutting back on red meat and foods that have a lot of added sugar. Lifestyle changes  Changes may include getting at least 40 minutes of aerobic exercise 3 times a week. Aerobic exercises include walking, biking, and swimming. Aerobic exercise along with a healthy diet can help you maintain a healthy weight.  Changes may also include quitting smoking. Medicines  Medicines are usually given if diet and lifestyle changes have failed to reduce your cholesterol to healthy levels.  Your health care provider may prescribe a statin medicine. Statin medicines have been shown to reduce cholesterol, which can reduce the risk of heart disease. Follow these instructions at home: Eating and drinking If told by your health care provider:  Eat  chicken (without skin), fish, veal, shellfish, ground turkey breast, and round or loin cuts of red meat.  Do not eat fried foods or fatty meats, such as hot dogs and salami.  Eat plenty of fruits, such as apples.  Eat plenty of vegetables, such as broccoli, potatoes, and carrots.  Eat beans, peas, and lentils.  Eat grains such as barley, rice, couscous, and bulgur wheat.  Eat pasta without cream sauces.  Use skim or nonfat milk, and eat low-fat or nonfat yogurt and cheeses.  Do not eat or drink whole milk, cream, ice cream, egg yolks, or hard cheeses.  Do not eat stick margarine or tub margarines that contain trans fats (also called partially hydrogenated oils).  Do not eat saturated tropical oils, such as coconut oil and palm oil.    Do not eat cakes, cookies, crackers, or other baked goods that contain trans fats.  General instructions  Exercise as directed by your health care provider. Increase your activity level with activities such as gardening, walking, and taking the stairs.  Take over-the-counter and prescription medicines only as told by your health care provider.  Do not use any products that contain nicotine or tobacco, such as cigarettes and e-cigarettes. If you need help quitting, ask your health care provider.  Keep all follow-up visits as told by your health care provider. This is important. Contact a health care provider if:  You are struggling to maintain a healthy diet or weight.  You need help to start on an exercise program.  You need help to stop smoking. Get help right away if:  You have chest pain.  You have trouble breathing. This information is not intended to replace advice given to you by your health care provider. Make sure you discuss any questions you have with your health care provider. Document Revised: 05/30/2017 Document Reviewed: 11/25/2015 Elsevier Patient Education  2020 Elsevier Inc.   

## 2019-09-16 ENCOUNTER — Encounter: Payer: Self-pay | Admitting: Adult Health

## 2019-09-28 ENCOUNTER — Encounter: Payer: Self-pay | Admitting: Adult Health

## 2019-12-14 ENCOUNTER — Ambulatory Visit: Payer: Self-pay | Admitting: Physician Assistant

## 2020-08-30 IMAGING — MG DIGITAL SCREENING BILATERAL MAMMOGRAM WITH TOMO AND CAD
6 of 10 series · 6 of 30 positions shown · non-contrast
Comparison: Previous exam(s).

CLINICAL DATA: Screening.

EXAM:
DIGITAL SCREENING BILATERAL MAMMOGRAM WITH TOMO AND CAD

[R CC synth-2D]
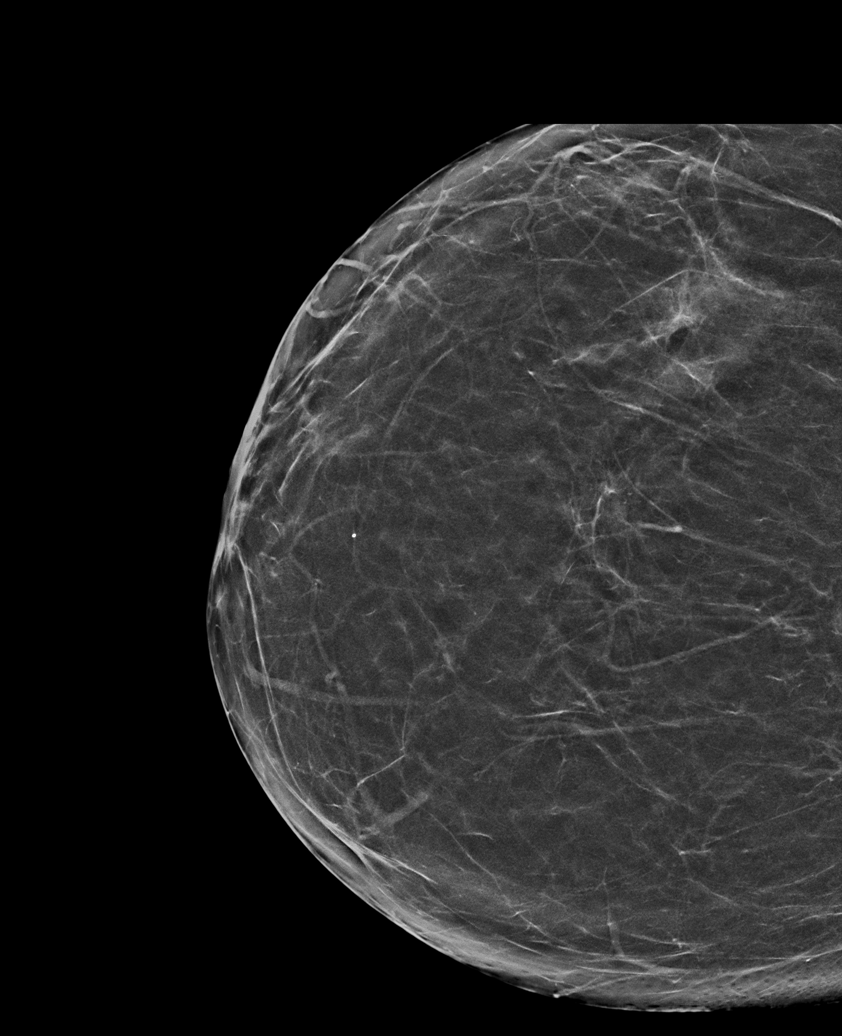

[R MLO synth-2D]
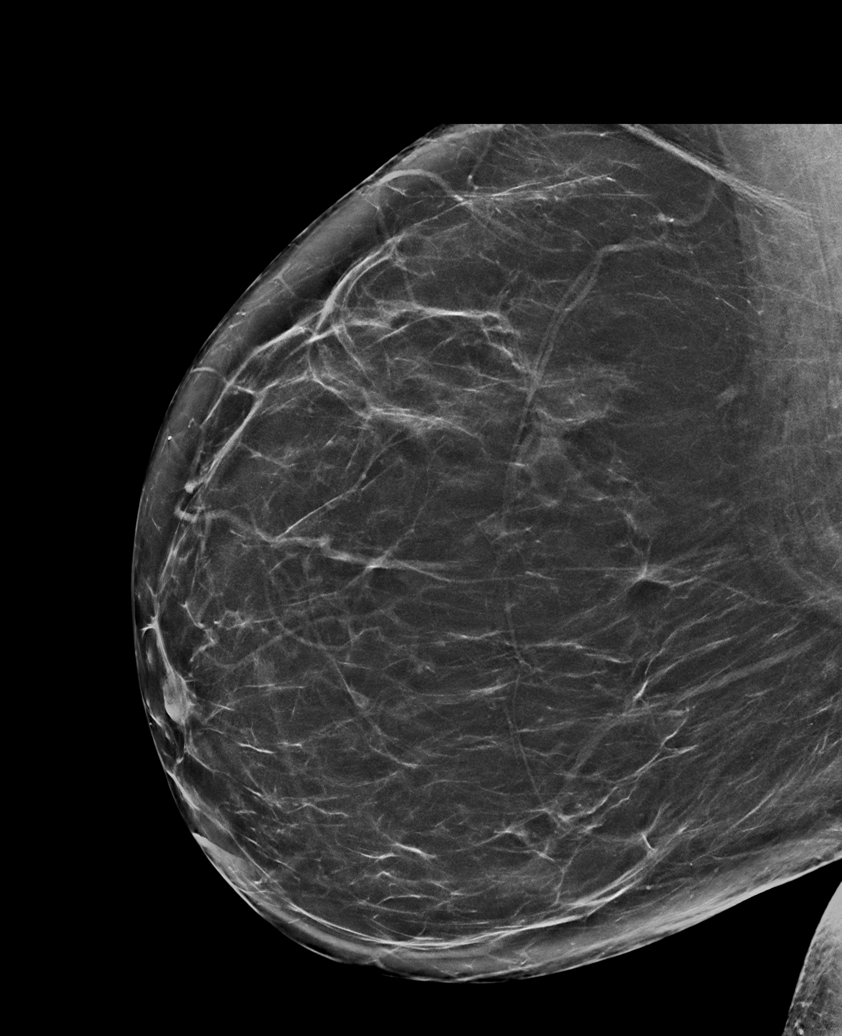

[L MLO synth-2D]
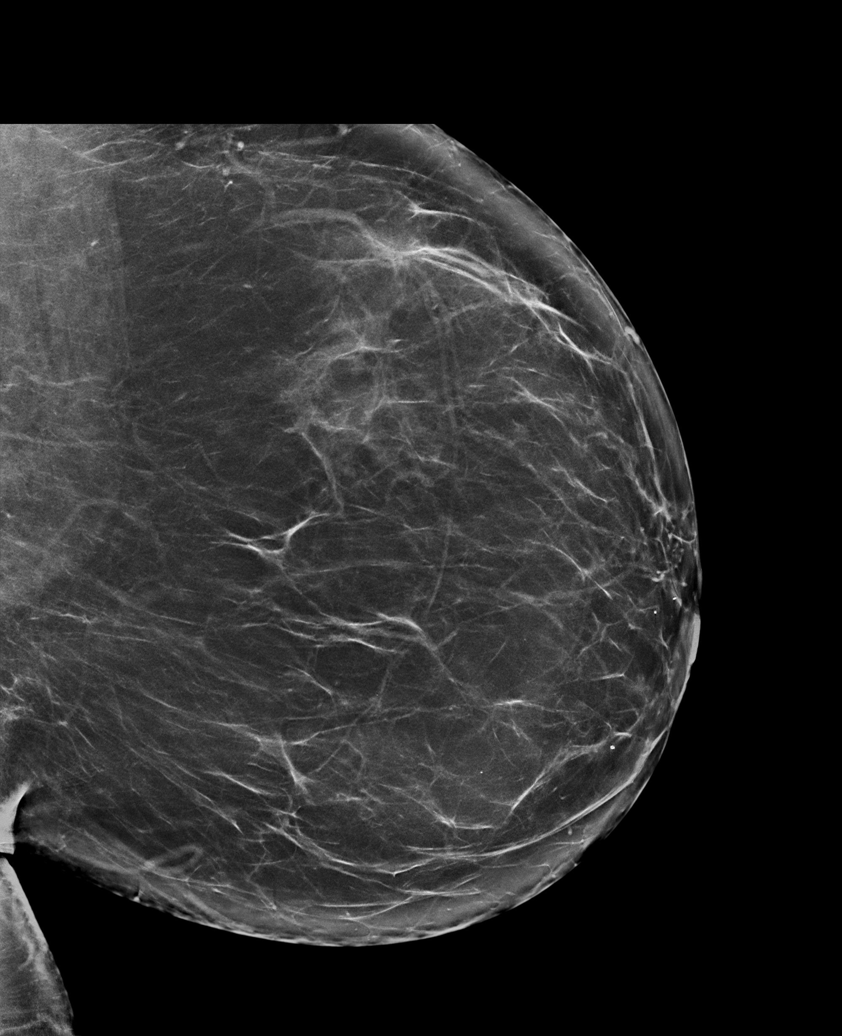

[L CC synth-2D (1 of 2)]
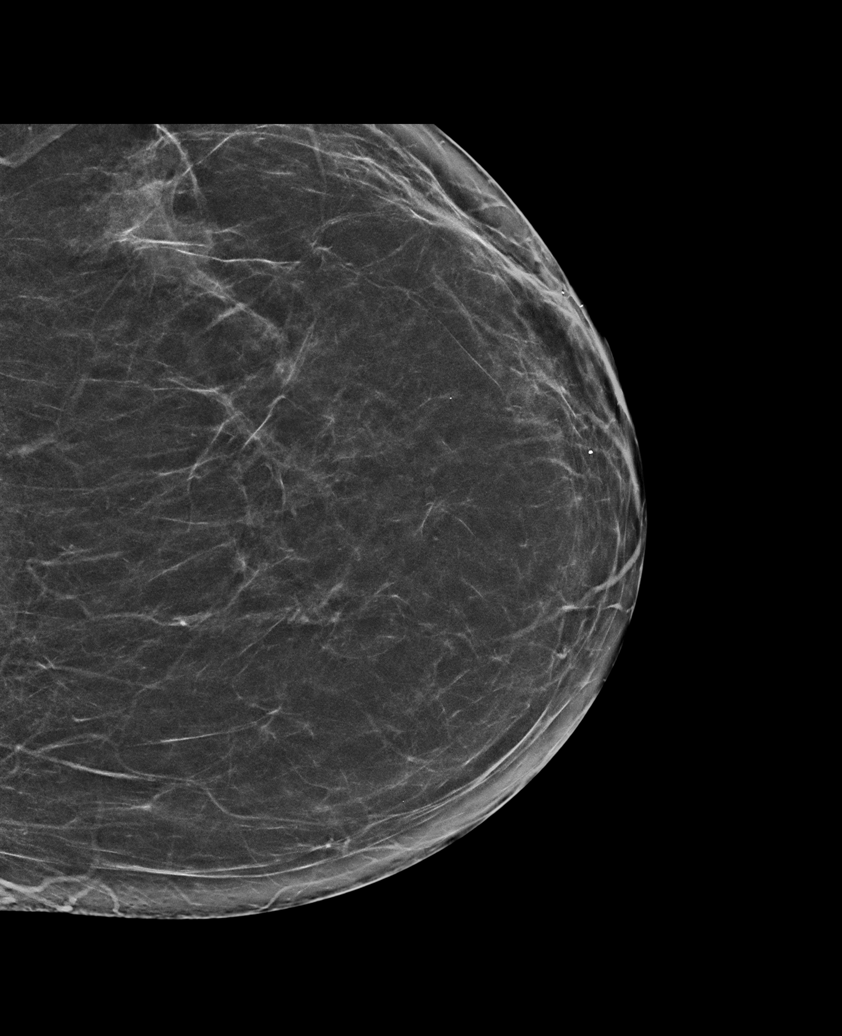

[L CC synth-2D (2 of 2)]
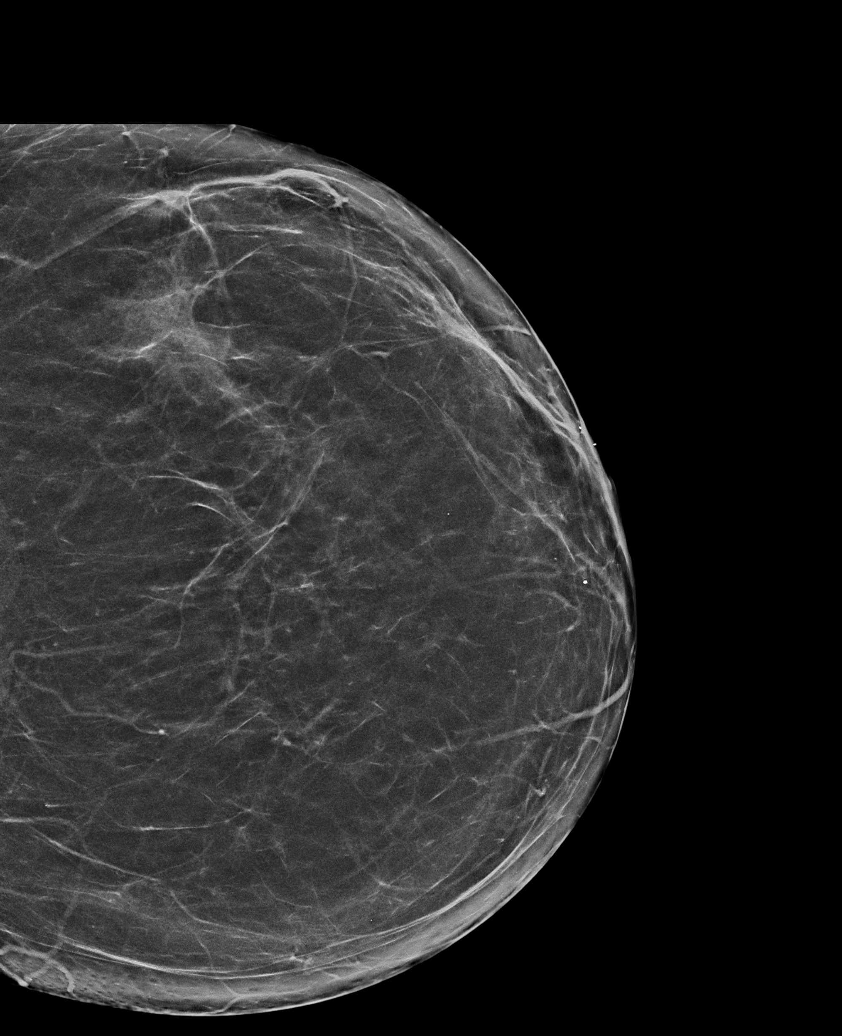

[L CC tomo · tomo slice 37/72.0]
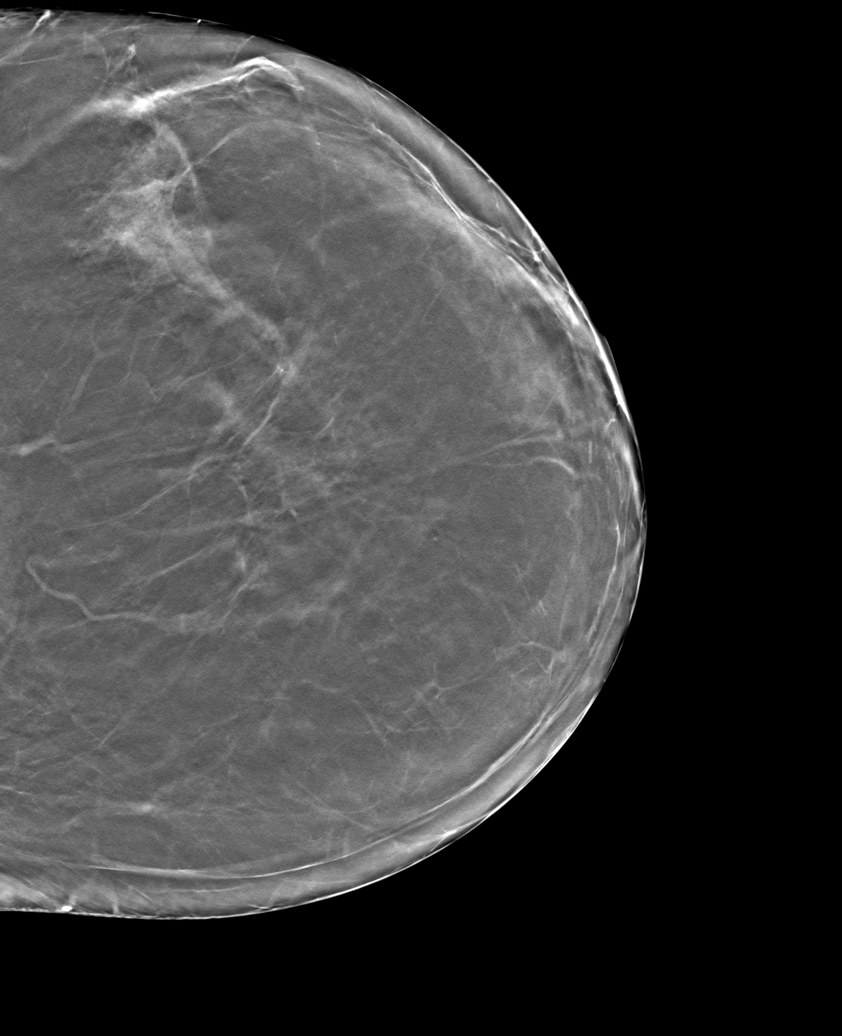

[6 of 30 positions shown; findings below may reference images not displayed]

ACR Breast Density Category b: There are scattered areas of
fibroglandular density.
FINDINGS: There are no findings suspicious for malignancy. Images were
processed with CAD.
IMPRESSION: No mammographic evidence of malignancy. A result letter of this
screening mammogram will be mailed directly to the patient.

RECOMMENDATION:
Screening mammogram in one year. (Code:[id number])

BI-RADS CATEGORY  1: Negative.

## 2022-06-05 ENCOUNTER — Encounter (INDEPENDENT_AMBULATORY_CARE_PROVIDER_SITE_OTHER): Payer: Self-pay | Admitting: *Deleted

## 2022-10-18 ENCOUNTER — Emergency Department (HOSPITAL_COMMUNITY)
Admission: EM | Admit: 2022-10-18 | Discharge: 2022-10-18 | Disposition: A | Discharge status: Home / Self Care | Payer: BC Managed Care – PPO | Attending: Emergency Medicine | Admitting: Emergency Medicine

## 2022-10-18 ENCOUNTER — Encounter (HOSPITAL_COMMUNITY): Payer: Self-pay | Admitting: Emergency Medicine

## 2022-10-18 ENCOUNTER — Other Ambulatory Visit: Payer: Self-pay

## 2022-10-18 ENCOUNTER — Emergency Department (HOSPITAL_COMMUNITY): Payer: BC Managed Care – PPO

## 2022-10-18 DIAGNOSIS — J45909 Unspecified asthma, uncomplicated: Secondary | ICD-10-CM | POA: Insufficient documentation

## 2022-10-18 DIAGNOSIS — Z7982 Long term (current) use of aspirin: Secondary | ICD-10-CM | POA: Insufficient documentation

## 2022-10-18 DIAGNOSIS — R55 Syncope and collapse: Secondary | ICD-10-CM | POA: Diagnosis present

## 2022-10-18 LAB — URINALYSIS, ROUTINE W REFLEX MICROSCOPIC
Bilirubin Urine: NEGATIVE
Glucose, UA: NEGATIVE mg/dL
Hgb urine dipstick: NEGATIVE
Ketones, ur: 5 mg/dL — AB
Nitrite: NEGATIVE
Protein, ur: 30 mg/dL — AB
Specific Gravity, Urine: 1.023 (ref 1.005–1.030)
pH: 5 (ref 5.0–8.0)

## 2022-10-18 LAB — CBC
HCT: 40.3 % (ref 36.0–46.0)
Hemoglobin: 13.5 g/dL (ref 12.0–15.0)
MCH: 30.5 pg (ref 26.0–34.0)
MCHC: 33.5 g/dL (ref 30.0–36.0)
MCV: 91 fL (ref 80.0–100.0)
Platelets: 283 10*3/uL (ref 150–400)
RBC: 4.43 MIL/uL (ref 3.87–5.11)
RDW: 13.2 % (ref 11.5–15.5)
WBC: 11.7 10*3/uL — ABNORMAL HIGH (ref 4.0–10.5)
nRBC: 0 % (ref 0.0–0.2)

## 2022-10-18 LAB — BASIC METABOLIC PANEL
Anion gap: 11 (ref 5–15)
BUN: 16 mg/dL (ref 6–20)
CO2: 25 mmol/L (ref 22–32)
Calcium: 9.5 mg/dL (ref 8.9–10.3)
Chloride: 101 mmol/L (ref 98–111)
Creatinine, Ser: 1.04 mg/dL — ABNORMAL HIGH (ref 0.44–1.00)
GFR, Estimated: >60 mL/min (ref >60)
Glucose, Bld: 120 mg/dL — ABNORMAL HIGH (ref 70–99)
Potassium: 3.9 mmol/L (ref 3.5–5.1)
Sodium: 137 mmol/L (ref 135–145)

## 2022-10-18 LAB — CBG MONITORING, ED: Glucose-Capillary: 94 mg/dL (ref 70–99)

## 2022-10-18 MED ORDER — SODIUM CHLORIDE 0.9 % IV BOLUS
1000.0000 mL | Freq: Once | INTRAVENOUS | Status: AC
  Administered 2022-10-18: 1000 mL via INTRAVENOUS

## 2022-10-18 NOTE — ED Provider Notes (Signed)
[place] EMERGENCY DEPARTMENT AT [facility] Provider Note   CSN: [id number] Arrival date & time: 10/18/22  1916     History  Chief Complaint  Patient presents with   Loss of Consciousness    Lorraine Kelly is a 57 y.o. female.  Patient is a 57 year old female with past medical history of asthma, GERD, hyperlipidemia.  Patient presenting today with complaints of loss of consciousness.  Patient was at her sister's house when she began "seeing spots", then became lightheaded, and apparently passed out.  There was reported jerky movements of her arms and legs when the episode occurred.  Patient lost consciousness for several seconds, then was awakened by her sister who had called 911.  There was no oral trauma and patient denies loss of bowel or bladder control.  Patient has been in her normal state of health with the exception of having 3 teeth extracted 3 days ago and reports not getting much sleep secondary to pain.  She denies any fevers or chills.  She denies other complaints.  She denies history of seizures or prior syncopal episodes.  The history is provided by the patient.       Home Medications Prior to Admission medications   Medication Sig Start Date End Date Taking? Authorizing Provider  albuterol (VENTOLIN HFA) 108 (90 Base) MCG/ACT inhaler Inhale 2 puffs into the lungs every 6 (six) hours as needed for wheezing or shortness of breath. Patient not taking: Reported on 08/26/2019 02/11/19   Jacquelin Hawking, PA-C  aspirin EC 81 MG tablet Take 81 mg by mouth daily.    [provider]  cetirizine (ZYRTEC) 10 MG tablet Take 1 tablet (10 mg total) by mouth daily. 05/27/17   Aliene Beams, MD  metFORMIN (GLUCOPHAGE XR) 500 MG 24 hr tablet Take 1 tablet (500 mg total) by mouth daily with breakfast. 09/14/19   Jacquelin Hawking, PA-C  Multiple Vitamin (MULTIVITAMIN) capsule Take 1 capsule by mouth daily.    [provider]  omeprazole (PRILOSEC) 20 MG capsule  Take 20 mg by mouth daily.    [provider]  Probiotic Product (PROBIOTIC DAILY PO) Take by mouth.    [provider]      Allergies    Other and Tape    Review of Systems   Review of Systems  All other systems reviewed and are negative.   Physical Exam Updated Vital Signs BP 115/77   Pulse 92   Temp 98.2 F (36.8 C) (Oral)   Resp 17   Ht 5\' 7"  (1.702 m)   Wt 88 kg   SpO2 95%   BMI 30.38 kg/m  Physical Exam Vitals and nursing note reviewed.  Constitutional:      General: She is not in acute distress.    Appearance: She is well-developed. She is not diaphoretic.  HENT:     Head: Normocephalic and atraumatic.  Eyes:     Extraocular Movements: Extraocular movements intact.     Pupils: Pupils are equal, round, and reactive to light.  Cardiovascular:     Rate and Rhythm: Normal rate and regular rhythm.     Heart sounds: No murmur heard.    No friction rub. No gallop.  Pulmonary:     Effort: Pulmonary effort is normal. No respiratory distress.     Breath sounds: Normal breath sounds. No wheezing.  Abdominal:     General: Bowel sounds are normal. There is no distension.     Palpations: Abdomen is soft.  Tenderness: There is no abdominal tenderness.  Musculoskeletal:        General: Normal range of motion.     Cervical back: Normal range of motion and neck supple.  Skin:    General: Skin is warm and dry.  Neurological:     General: No focal deficit present.     Mental Status: She is alert and oriented to person, place, and time.     Cranial Nerves: No cranial nerve deficit.     Sensory: No sensory deficit.     Motor: No weakness.     Coordination: Coordination normal.     ED Results / Procedures / Treatments   Labs (all labs ordered are listed, but only abnormal results are displayed) Labs Reviewed  BASIC METABOLIC PANEL - Abnormal; Notable for the following components:      Result Value   Glucose, Bld 120 (*)    Creatinine, Ser 1.04  (*)    All other components within normal limits  CBC - Abnormal; Notable for the following components:   WBC 11.7 (*)    All other components within normal limits  URINALYSIS, ROUTINE W REFLEX MICROSCOPIC - Abnormal; Notable for the following components:   APPearance CLOUDY (*)    Ketones, ur 5 (*)    Protein, ur 30 (*)    Leukocytes,Ua MODERATE (*)    Bacteria, UA RARE (*)    All other components within normal limits  CBG MONITORING, ED    EKG EKG Interpretation  Date/Time:  Friday Oct 18 2022 19:37:21 EDT Ventricular Rate:  99 PR Interval:  150 QRS Duration: 74 QT Interval:  374 QTC Calculation: 479 R Axis:   -4 Text Interpretation: Normal sinus rhythm Normal ECG When compared with ECG of 25-May-2017 13:30, No significant change was found Confirmed by Geoffery Lyons ([id number]) on 10/18/2022 11:19:12 PM  Radiology CT Head Wo Contrast  Result Date: 10/18/2022 CLINICAL DATA:  Headache EXAM: CT HEAD WITHOUT CONTRAST TECHNIQUE: Contiguous axial images were obtained from the base of the skull through the vertex without intravenous contrast. RADIATION DOSE REDUCTION: This exam was performed according to the departmental dose-optimization program which includes automated exposure control, adjustment of the mA and/or kV according to patient size and/or use of iterative reconstruction technique. COMPARISON:  None Available. FINDINGS: Brain: There is no mass, hemorrhage or extra-axial collection. The size and configuration of the ventricles and extra-axial CSF spaces are normal. The brain parenchyma is normal, without acute or chronic infarction. Vascular: No abnormal hyperdensity of the major intracranial arteries or dural venous sinuses. No intracranial atherosclerosis. Skull: The visualized skull base, calvarium and extracranial soft tissues are normal. Sinuses/Orbits: No fluid levels or advanced mucosal thickening of the visualized paranasal sinuses. No mastoid or middle ear effusion. The orbits  are normal. IMPRESSION: Normal head CT. Electronically Signed   By: Deatra Robinson M.D.   On: 10/18/2022 22:26    Procedures Procedures    Medications Ordered in ED Medications  sodium chloride 0.9 % bolus 1,000 mL (0 mLs Intravenous Stopped 10/18/22 2309)    ED Course/ Medical Decision Making/ A&P  Patient is a 57 year old female presenting with loss of consciousness as described in the HPI.  Patient presents here with stable vital signs.  She is afebrile with no hypoxia.  Physical examination is unremarkable and patient is neurologically intact.  Workup initiated including CBC, metabolic panel, both of which are unremarkable.  CT scan of the head obtained showing no acute intracranial process.  At the time  of my evaluation, patient had been in the emergency department for nearly 4 hours.  She is now back to baseline and feels well.  Her workup is unremarkable.  I am uncertain as to the exact etiology of this episode, however it sounds like a syncopal spell with tonic-clonic type movements.  It sounds as though there was no postictal phase and there was no oral trauma or loss of bowel or bladder function.  Patient has no prior history of seizures.  At this point, patient will be discharged with diagnosis of syncope.  She is to drink plenty of fluids, rest, and follow-up with primary doctor in the next week.  Final Clinical Impression(s) / ED Diagnoses Final diagnoses:  None    Rx / DC Orders ED Discharge Orders     None         Geoffery Lyons, MD 10/18/22 2321

## 2022-10-18 NOTE — Discharge Instructions (Signed)
Drink plenty of fluids and get plenty of rest.  Follow-up with primary doctor in the next week, and return to the ER symptoms significantly worsen or change.

## 2022-10-18 NOTE — ED Triage Notes (Addendum)
Pt via POV after witnessed syncopal episode at home at around 7pm. Pt refused EMS transport and had family bring her instead. Per family present at the time of the event, pt lost consciousness and then seemed to briefly convulse. No prior hx seizures. Pt had 3 teeth extracted three days ago and has been treating pain at home with OTC meds.

## 2022-11-11 ENCOUNTER — Encounter (INDEPENDENT_AMBULATORY_CARE_PROVIDER_SITE_OTHER): Payer: Self-pay | Admitting: *Deleted

## 2023-02-19 ENCOUNTER — Encounter: Payer: Self-pay | Admitting: *Deleted

## 2023-08-20 ENCOUNTER — Encounter: Payer: Self-pay | Admitting: *Deleted

## 2023-09-16 DIAGNOSIS — S8261XD Displaced fracture of lateral malleolus of right fibula, subsequent encounter for closed fracture with routine healing: Secondary | ICD-10-CM | POA: Diagnosis not present

## 2023-10-30 ENCOUNTER — Telehealth (INDEPENDENT_AMBULATORY_CARE_PROVIDER_SITE_OTHER): Payer: Self-pay | Admitting: *Deleted

## 2023-10-30 NOTE — Telephone Encounter (Signed)
 Patient returned your call  [phone]

## 2023-11-12 ENCOUNTER — Telehealth: Payer: Self-pay | Admitting: *Deleted

## 2023-11-12 ENCOUNTER — Encounter: Payer: Self-pay | Admitting: Gastroenterology

## 2023-11-12 ENCOUNTER — Ambulatory Visit: Admitting: Gastroenterology

## 2023-11-12 VITALS — BP 135/81 | HR 109 | Temp 97.7°F | Ht 67.0 in | Wt 206.8 lb

## 2023-11-12 DIAGNOSIS — K5900 Constipation, unspecified: Secondary | ICD-10-CM

## 2023-11-12 DIAGNOSIS — R1900 Intra-abdominal and pelvic swelling, mass and lump, unspecified site: Secondary | ICD-10-CM | POA: Insufficient documentation

## 2023-11-12 DIAGNOSIS — K219 Gastro-esophageal reflux disease without esophagitis: Secondary | ICD-10-CM | POA: Diagnosis not present

## 2023-11-12 DIAGNOSIS — R198 Other specified symptoms and signs involving the digestive system and abdomen: Secondary | ICD-10-CM | POA: Insufficient documentation

## 2023-11-12 DIAGNOSIS — R1030 Lower abdominal pain, unspecified: Secondary | ICD-10-CM | POA: Diagnosis not present

## 2023-11-12 DIAGNOSIS — R1904 Left lower quadrant abdominal swelling, mass and lump: Secondary | ICD-10-CM | POA: Diagnosis not present

## 2023-11-12 DIAGNOSIS — Z860101 Personal history of adenomatous and serrated colon polyps: Secondary | ICD-10-CM

## 2023-11-12 MED ORDER — LUBIPROSTONE 24 MCG PO CAPS: 24.0000 ug | ORAL_CAPSULE | Freq: Two times a day (BID) | ORAL | 5 refills | Status: AC

## 2023-11-12 NOTE — Telephone Encounter (Signed)
 Per availity Procedure Code [id number]  Status NO AUTH REQUIRED  Message PRECERT IS NOT REQUIRED OR ONLY REQUIRED IN UNIQUE CIRCUMSTANCES FOR MEDICAID REFER TO PRIOR AUTH TOOL ON [email or web address] FOR ALL OTHERS REFER TO CODE SEARCH TOOL ON [email or web address] COVERAGE OF SERVICES ARE SUBJECT TO BENEFITS AND ELIGIBILITY

## 2023-11-12 NOTE — Patient Instructions (Addendum)
 Start lubiprostone 24mcg twice daily with food for constipation. Let me know if too expensive or does not work well.  CT scan of your abdomen to evaluate that bulge. Colonoscopy in near future.

## 2023-11-12 NOTE — Progress Notes (Unsigned)
 GI Office Note    Referring Provider: Skillman, Katherine E, * Primary Care Physician:  Skillman, Katherine E, PA-C  Primary Gastroenterologist:Charles K. Mordechai April, DO   Chief Complaint   Chief Complaint  Patient presents with   Constipation    Having issues with constipation, abdominal pain and states that she can feel a bulge on lower left side of abdomen.     History of Present Illness   Lorraine Kelly is a 59 y.o. female presenting today at the request of Katherine Skillman, PA-C for abdominal pain, constipation, colon cancer screening.   Chronic issues with constipation but had been doing well. Typically would have a BM every other day but then have large stool. But for past two months, hard to have BM. Fees distended. Felt like stool locked up high. Stool softeners not helpful. BM every 4-5 days or more frequent but stools tiny and incomplete. Stool hard/dry. No melena, brbpr. Feels bulge in RLQ region several months ago. Comes and goes. Omeprazole helps her heartburn a lot. No dysphagia. Abdominal pain more right sided.   Colonosocpy 2018: -diverticulosis -3mm polyp, tubular adenoma -random colon biopsies, negative.   EGD 11/2017: -no endoscopic esophageal abnormality to explain dysphagia, esophagus dilation performed, s/p bx benign -mild gastritis s/p bx no h.pylori -duodenal bx benign  Medications   Current Outpatient Medications  Medication Sig Dispense Refill   amphetamine-dextroamphetamine (ADDERALL) 30 MG tablet Take 1 tablet by mouth daily.     cetirizine  (ZYRTEC ) 10 MG tablet Take 1 tablet (10 mg total) by mouth daily. 90 tablet 3       5   omeprazole (PRILOSEC) 20 MG capsule Take 20 mg by mouth daily.     ondansetron  (ZOFRAN -ODT) 4 MG disintegrating tablet Take 4 mg by mouth every 12 (twelve) hours as needed.     PREMPRO 0.625-2.5 MG tablet Take 1 tablet by mouth daily.     No current facility-administered medications for this visit.    Allergies    Allergies as of 11/12/2023 - Review Complete 10/18/2022  Allergen Reaction Noted   Other  02/27/2017   Tape Swelling 01/17/2017    Past Medical History   Past Medical History:  Diagnosis Date   Allergy    Complication of anesthesia    waking up too soon; severe itching and rash   Depression    GERD (gastroesophageal reflux disease)    Plantar fasciitis    PONV (postoperative nausea and vomiting)     Past Surgical History   Past Surgical History:  Procedure Laterality Date   BIOPSY  11/12/2017   Procedure: BIOPSY;  Surgeon: Alyce Jubilee, MD;  [facility];  Service: Endoscopy;;  duodenum gastric esophageal   COLONOSCOPY  01/2017   [facility]: Terminal ileum normal, transverse colon tubular adenoma removed, diverticulosis of the transverse and left colon.  Random colon biopsies negative.  Next colonoscopy in August 2023.   COLPOSCOPY     DILITATION & CURRETTAGE/HYSTROSCOPY WITH NOVASURE ABLATION N/A 11/17/2014   Procedure: HYSTEROSCOPY, UTERINE CURETTAGE, ENDOMETRIAL ABLATION USING NOVASURE;  Surgeon: Albino Hum, MD;  Location: [facility];  Service: Gynecology;  Laterality: N/A;  Uterine Cavity Length 5.0cm Uterine Cavity Width 4.3cm Power 118 Time 1 minute 18 seconds   ENDOMETRIAL ABLATION     ESOPHAGOGASTRODUODENOSCOPY N/A 11/12/2017   Procedure: ESOPHAGOGASTRODUODENOSCOPY (EGD);  Surgeon: Alyce Jubilee, MD;  [facility];  Service: Endoscopy;  Laterality: N/A;  1:00pm  SAVORY DILATION N/A 11/12/2017   Procedure: SAVORY DILATION;  Surgeon: Alyce Jubilee, MD;  [facility];  Service: Endoscopy;  Laterality: N/A;   TUBAL LIGATION  12/1988   WISDOM TOOTH EXTRACTION Bilateral 1996    Past Family History   Family History  Problem Relation Age of Onset   Hypertension Mother    Hypertension Father    Cancer Father        prostate   ADD / ADHD Sister    Depression Sister    Diabetes Daughter     Diabetes Maternal Grandmother    Heart disease Maternal Grandmother    Ulcerative colitis Maternal Grandmother    Cancer Maternal Grandfather        throat, prostate   Hypertension Maternal Grandfather    Diabetes Paternal Grandmother    Hypertension Paternal Grandmother    Arthritis Paternal Grandmother    ADD / ADHD Sister    Celiac disease Neg Hx    Colon cancer Neg Hx     Past Social History   Social History   Socioeconomic History   Marital status: Single    Spouse name: Not on file   Number of children: Not on file   Years of education: Not on file   Highest education level: Not on file  Occupational History   Not on file  Tobacco Use   Smoking status: Every Day    Current packs/day: 0.50    Average packs/day: 0.5 packs/day for 20.0 years (10.0 ttl pk-yrs)    Types: Cigarettes   Smokeless tobacco: Never  Vaping Use   Vaping status: Former  Substance and Sexual Activity   Alcohol use: Yes    Comment: socially, rarely 2-3 times a year   Drug use: Yes    Types: Marijuana    Comment: occasional use   Sexual activity: Yes    Partners: Male    Birth control/protection: Surgical  Other Topics Concern   Not on file  Social History Narrative   Works at [employer]. Grew up in [place], [place]. Single. Has three children.    Social Drivers of Corporate investment banker Strain: Not on file  Food Insecurity: Not on file  Transportation Needs: Not on file  Physical Activity: Insufficiently Active (10/03/2020)   [facility] from [facility], [facility]   Exercise Vital Sign    Days of Exercise per Week: 3 days    Minutes of Exercise per Session: 40 min  Stress: Not on file  Social Connections: Not on file  Intimate Partner Violence: Not At Risk (10/03/2020)   Received from [facility], [facility]   Humiliation, Afraid, Rape, and Kick questionnaire    Fear of Current or Ex-Partner: No    Emotionally Abused: No    Physically Abused: No     Sexually Abused: No    Review of Systems   General: Negative for anorexia, weight loss, fever, chills, fatigue, weakness. Eyes: Negative for vision changes.  ENT: Negative for hoarseness, difficulty swallowing , nasal congestion. CV: Negative for chest pain, angina, palpitations, dyspnea on exertion, peripheral edema.  Respiratory: Negative for dyspnea at rest, dyspnea on exertion, cough, sputum, wheezing.  GI: See history of present illness. GU:  Negative for dysuria, hematuria, urinary incontinence, urinary frequency, nocturnal urination.  MS: Negative for joint pain, low back pain.  Derm: Negative for rash or itching.  Neuro: Negative for weakness, abnormal sensation, seizure, frequent headaches, memory loss,  confusion.  Psych: Negative for anxiety, depression, suicidal ideation, hallucinations.  Endo: Negative for unusual weight change.  Heme: Negative for bruising or bleeding. Allergy: Negative for rash or hives.  Physical Exam   BP 135/81 (BP Location: Right Arm, Patient Position: Sitting, Cuff Size: Large)   Pulse (!) 109   Temp 97.7 F (36.5 C) (Oral)   Ht 5\' 7"  (1.702 m)   Wt 206 lb 12.8 oz (93.8 kg)   LMP  (LMP Unknown)   SpO2 98%   BMI 32.39 kg/m    General: Well-nourished, well-developed in no acute distress.  Head: Normocephalic, atraumatic.   Eyes: Conjunctiva pink, no icterus. Mouth: Oropharyngeal mucosa moist and pink , no lesions erythema or exudate. Neck: Supple without thyromegaly, masses, or lymphadenopathy.  Lungs: Clear to auscultation bilaterally.  Heart: Regular rate and rhythm, no murmurs rubs or gallops.  Abdomen: Bowel sounds are normal, nontender, nondistended, no hepatosplenomegaly or masses,  no abdominal bruits, no rebound or guarding.  LLQ bulge/mass-like about size of golf ball without obvious wall defect Rectal: not performed Extremities: No lower extremity edema. No clubbing or deformities.  Neuro: Alert and oriented x 4 , grossly  normal neurologically.  Skin: Warm and dry, no rash or jaundice.   Psych: Alert and cooperative, normal mood and affect.  Labs   No recent labs  Imaging Studies   No results found.  Assessment/Plan:   Change in bowels/worsening constipation: -worsening constipation, incomplete stools.  -lubiprostone 24mcg BID with food  LLQ bulge/mass: nontender, noted for several months, intermittent. Suspect hernia although no clear abdominal wall defect.  -CT A/P with contrast  Lower abdominal pain: likely due to constipation  GERD: well controlled -continue omeprazole 20mg  daily  H/O adenomatous colon polyps: -due surveillance colonoscopy. Possible LLQ abdominal hernia noted.  -ASA 2 - I have discussed the risks, alternatives, benefits with regards to but not limited to the risk of reaction to medication, bleeding, infection, perforation and the patient is agreeable to proceed. Written consent to be obtained.      Trudie Fuse. Harles Lied, MHS, PA-C [facility]

## 2023-11-13 NOTE — Telephone Encounter (Signed)
 Called pt, LMOVM to call back to give CT appt details and schedule tcs w/ Dr. Mordechai April ASA 2, bisacodyl 10mg  every day x 3 days prior

## 2023-11-17 NOTE — Telephone Encounter (Signed)
 LMOVM to return call  Pt left vm returning call

## 2023-11-19 ENCOUNTER — Encounter: Payer: Self-pay | Admitting: *Deleted

## 2023-11-19 ENCOUNTER — Other Ambulatory Visit: Payer: Self-pay | Admitting: *Deleted

## 2023-11-19 MED ORDER — PEG 3350-KCL-NA BICARB-NACL 420 G PO SOLR: 4000.0000 mL | Freq: Once | ORAL | 0 refills | Status: AC

## 2023-11-19 NOTE — Telephone Encounter (Signed)
 Pt made aware of CT appt. Has been scheduled for 12/02/23 for TCS, instructions mailed (pt informed if not received instructions by end of next week, will need to come by office to pick them up) prep sent to the pharmacy

## 2023-11-28 ENCOUNTER — Ambulatory Visit (HOSPITAL_COMMUNITY)

## 2023-11-28 ENCOUNTER — Encounter (HOSPITAL_COMMUNITY): Payer: Self-pay

## 2023-11-28 NOTE — Therapy (Incomplete)
 OUTPATIENT PHYSICAL THERAPY LOWER EXTREMITY EVALUATION   Patient Name: Lorraine Kelly MRN: [id number] DOB:1965/09/08, 58 y.o., female Today's Date: 11/28/2023  END OF SESSION:   Past Medical History:  Diagnosis Date   Allergy    Complication of anesthesia    waking up too soon; severe itching and rash   Depression    GERD (gastroesophageal reflux disease)    Plantar fasciitis    PONV (postoperative nausea and vomiting)    Past Surgical History:  Procedure Laterality Date   BIOPSY  11/12/2017   Procedure: BIOPSY;  Surgeon: Alyce Jubilee, MD;  [facility];  Service: Endoscopy;;  duodenum gastric esophageal   COLONOSCOPY  01/2017   [facility]: Terminal ileum normal, transverse colon tubular adenoma removed, diverticulosis of the transverse and left colon.  Random colon biopsies negative.  Next colonoscopy in August 2023.   COLPOSCOPY     DILITATION & CURRETTAGE/HYSTROSCOPY WITH NOVASURE ABLATION N/A 11/17/2014   Procedure: HYSTEROSCOPY, UTERINE CURETTAGE, ENDOMETRIAL ABLATION USING NOVASURE;  Surgeon: Albino Hum, MD;  Location: [facility];  Service: Gynecology;  Laterality: N/A;  Uterine Cavity Length 5.0cm Uterine Cavity Width 4.3cm Power 118 Time 1 minute 18 seconds   ENDOMETRIAL ABLATION     ESOPHAGOGASTRODUODENOSCOPY N/A 11/12/2017   Procedure: ESOPHAGOGASTRODUODENOSCOPY (EGD);  Surgeon: Alyce Jubilee, MD;  [facility];  Service: Endoscopy;  Laterality: N/A;  1:00pm   SAVORY DILATION N/A 11/12/2017   Procedure: SAVORY DILATION;  Surgeon: Alyce Jubilee, MD;  [facility];  Service: Endoscopy;  Laterality: N/A;   TUBAL LIGATION  12/1988   WISDOM TOOTH EXTRACTION Bilateral 1996   Patient Active Problem List   Diagnosis Date Noted   Change in bowel function 11/12/2023   Constipation 11/12/2023   H/O adenomatous polyp of colon 11/12/2023   Abdominal mass 11/12/2023   Esophageal dysphagia 10/10/2017    Diarrhea 10/10/2017   Bloating 10/10/2017   Anxiety and depression 09/02/2017   HTN, goal below 140/90 09/02/2017   History of postmenopausal HRT 09/02/2017   Dyspepsia 09/02/2017   Mixed hyperlipidemia 07/04/2017   Acute diverticulitis 12/01/2016   Obesity 12/01/2016   Abdominal pain 11/30/2016   Allergy 11/30/2016   Heavy menses 11/17/2014   Premenstrual symptom 11/17/2014   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  Rationale for Evaluation and Treatment: Rehabilitation  ONSET DATE: ***  SUBJECTIVE:   SUBJECTIVE STATEMENT: ***  PERTINENT HISTORY: *** PAIN:  Are you having pain? {OPRCPAIN:27236}  PRECAUTIONS: {Therapy precautions:24002}  RED FLAGS: {PT Red Flags:29287}   WEIGHT BEARING RESTRICTIONS: {Yes ***/No:24003}  FALLS:  Has patient fallen in last 6 months? {fallsyesno:27318}  LIVING ENVIRONMENT: Lives with: {OPRC lives with:25569::lives with their family} Lives in: {Lives in:25570} Stairs: {opstairs:27293} Has following equipment at home: {Assistive devices:23999}  OCCUPATION: ***  PLOF: {PLOF:24004}  PATIENT GOALS: ***  NEXT MD VISIT: ***  OBJECTIVE:  Note: Objective measures were completed at Evaluation unless otherwise noted.  DIAGNOSTIC FINDINGS: ***  PATIENT SURVEYS:  LEFS {:PHR,OPRCLEFSTABLE}   COGNITION: Overall cognitive status: {cognition:24006}     SENSATION: {sensation:27233}  EDEMA:  {edema:24020}  MUSCLE LENGTH: Hamstrings: Right *** deg; Left *** deg Andy Bannister test: Right *** deg; Left ***  deg  POSTURE: {posture:25561}  PALPATION: ***  LOWER EXTREMITY ROM:  Active ROM Right eval Left eval  Hip flexion    Hip  extension    Hip abduction    Hip adduction    Hip internal rotation    Hip external rotation    Knee flexion    Knee extension    Ankle dorsiflexion    Ankle plantarflexion    Ankle inversion    Ankle eversion     (Blank rows = not tested)  LOWER EXTREMITY MMT:  MMT Right eval Left eval  Hip flexion    Hip extension    Hip abduction    Hip adduction    Hip internal rotation    Hip external rotation    Knee flexion    Knee extension    Ankle dorsiflexion    Ankle plantarflexion    Ankle inversion    Ankle eversion     (Blank rows = not tested)  LOWER EXTREMITY SPECIAL TESTS:  {LEspecialtests:26242}  FUNCTIONAL TESTS:  5 times sit to stand: *** 2 minute walk test: ***  GAIT: Distance walked: *** Assistive device utilized: {Assistive devices:23999} Level of assistance: {Levels of assistance:24026} Comments: ***                                                                                                                                TREATMENT DATE:  11/28/2023  Vitals: BP: HR: O2 sat: Evaluation: -ROM measured, Strength assessed, HEP prescribed, pt educated on prognosis, findings, and importance of HEP compliance if given.  Manual Therapy: -CPA of Lumbar Spinal segments L3-L5, grade II-III mobilizations -STM of Lumbar Paraspinal musculature  Therapeutic Exercise: -Supine bridges 2 sets of 10 reps, 3 second holds, symptomatic, pt cued for max hip extension -Standing 3 way hip 1 sets 10 reps, bilaterally, pt cued for upright trunk and maintaining of neutral spine -Lateral stepping 3 laps 20 feet per lap, second 2 with RTB around ankles, pt cued for upright posture -Forward lunges, 1 set of 5 reps better performance going into RLE, pt cued for core activation and upright posture       PATIENT EDUCATION:  Education details: Pt was educated on findings of PT evaluation, prognosis, frequency of therapy visits and rationale, attendance policy, and HEP if  given.   Person educated: {Person educated:25204} Education method: {Education Method:25205} Education comprehension: {Education Comprehension:25206}  HOME EXERCISE PROGRAM: ***  ASSESSMENT:  CLINICAL IMPRESSION: Patient is a 58 y.o. female who was seen today for physical therapy evaluation and treatment for mildly displaced avulsion fracture of lateral malleolus.   Patient demonstrates decreased LE strength, abnormal pain rating, and impaired balance. Patient also demonstrates difficulty with ambulation during today's session with decreased stride length and velocity noted. Patient also demonstrates ***. Patient requires ***. Patient would benefit from skilled physical therapy for increased endurance with ambulation, increased LE strength, and balance for improved gait quality, return to higher level  of function with ADLs, and progress towards therapy goals.   OBJECTIVE IMPAIRMENTS: {opptimpairments:25111}.   ACTIVITY LIMITATIONS: {activitylimitations:27494}  PARTICIPATION LIMITATIONS: {participationrestrictions:25113}  PERSONAL FACTORS: {Personal factors:25162} are also affecting patient's functional outcome.   REHAB POTENTIAL: {rehabpotential:25112}  CLINICAL DECISION MAKING: {clinical decision making:25114}  EVALUATION COMPLEXITY: {Evaluation complexity:25115}   GOALS: Goals reviewed with patient? {yes/no:20286}  SHORT TERM GOALS: Target date: ***  Patient will demonstrate evidence of independence with individualized HEP and will report compliance for at least 3 days per week for optimized progression towards remaining therapy goals. Baseline: *** Goal status: {GOALSTATUS:25110}  2.  Patient will report a decrease in pain level during community ambulation by at least 2 points for improved quality of life. Baseline: *** Goal status: {GOALSTATUS:25110}     LONG TERM GOALS: Target date: ***  Pt will demonstrate a an increase of at least 9 points on the LEFS for  improved performance of community ambulation and ADL. Baseline: *** Goal status: {GOALSTATUS:25110}  2.  Pt will improve 2 MWT by *** in order to demonstrate improved functional ambulatory capacity in community setting.  Baseline: *** Goal status: {GOALSTATUS:25110}  3.  Pt will demonstrate WFL ROM (flexion and extension) in right knee, for increased mobility and maximal efficiency of gait cycle during ambulation. Baseline: *** Goal status: {GOALSTATUS:25110}  4.  Pt will demonstrate at least 4-/5 MMT for right lower extremity for increased strength during ADL and community ambulation. Baseline: *** Goal status: {GOALSTATUS:25110}  5.  Pt will improve *** by *** in order to improve *** during functional activities.. Baseline: *** Goal status: {GOALSTATUS:25110}    PLAN:  PT FREQUENCY: {rehab frequency:25116}  PT DURATION: {rehab duration:25117}  PLANNED INTERVENTIONS: {rehab planned interventions:25118::97110-Therapeutic exercises,97530- Therapeutic [phone]- Neuromuscular re-education,97535- Self KVQQ,59563- Manual therapy}  PLAN FOR NEXT SESSION: ***   Armond Bertin, PT, DPT [facility] Office: [phone] 7:54 AM, 11/28/23

## 2023-11-28 NOTE — Therapy (Signed)
 [facility] Outpatient Rehabilitation at [facility] [address] [place], [place], [address] Phone: [phone]   Fax:  [phone]  Patient Details  Name: Lorraine Kelly MRN: [id number] Date of Birth: 17-Apr-1966 Referring Provider:  No ref. provider found  Encounter Date: 11/28/2023  Pt was called concerning her missed 11:45 AM evaluation appointment this morning. Pt was left voicemail with instructions to call and reschedule at her earliest convenience.  Armond Bertin, PT, DPT [facility] Office: [phone] 3:50 PM, 11/28/23   [facility] Outpatient Rehabilitation at [facility] [address] [place], [place], [address] Phone: [phone]   Fax:  [phone]

## 2023-11-29 ENCOUNTER — Encounter (HOSPITAL_COMMUNITY): Payer: Self-pay | Admitting: Anesthesiology

## 2023-12-01 ENCOUNTER — Encounter: Payer: Self-pay | Admitting: *Deleted

## 2023-12-01 NOTE — Telephone Encounter (Signed)
 Pt says she needed to reschedule her procedure for tomorrow 12/02/23. She didn't give a reason for rescheduling. She has been rescheduled to 12/23/23 at 12:45 pm. Updated instructions mailed to pt

## 2023-12-20 DIAGNOSIS — Z419 Encounter for procedure for purposes other than remedying health state, unspecified: Secondary | ICD-10-CM | POA: Diagnosis not present

## 2023-12-23 ENCOUNTER — Telehealth: Payer: Self-pay | Admitting: *Deleted

## 2023-12-23 ENCOUNTER — Ambulatory Visit (HOSPITAL_COMMUNITY): Admission: RE | Admit: 2023-12-23 | Source: Home / Self Care | Admitting: Internal Medicine

## 2023-12-23 ENCOUNTER — Encounter (HOSPITAL_COMMUNITY): Admission: RE | Payer: Self-pay | Source: Home / Self Care

## 2023-12-23 SURGERY — COLONOSCOPY
Anesthesia: Choice

## 2023-12-23 NOTE — Telephone Encounter (Signed)
 Patient was a no show for procedure today

## 2023-12-26 ENCOUNTER — Ambulatory Visit (HOSPITAL_COMMUNITY)

## 2024-01-20 DIAGNOSIS — Z419 Encounter for procedure for purposes other than remedying health state, unspecified: Secondary | ICD-10-CM | POA: Diagnosis not present

## 2024-02-20 DIAGNOSIS — Z419 Encounter for procedure for purposes other than remedying health state, unspecified: Secondary | ICD-10-CM | POA: Diagnosis not present

## 2024-05-21 DIAGNOSIS — Z419 Encounter for procedure for purposes other than remedying health state, unspecified: Secondary | ICD-10-CM | POA: Diagnosis not present
